# Patient Record
Sex: Female | Born: 1935
Health system: Southern US, Community
[De-identification: ages and names within clinical notes are randomized; demographics above are authoritative.]

## PROBLEM LIST (undated history)

## (undated) DIAGNOSIS — I639 Cerebral infarction, unspecified: Secondary | ICD-10-CM

## (undated) DIAGNOSIS — M1990 Unspecified osteoarthritis, unspecified site: Secondary | ICD-10-CM

## (undated) DIAGNOSIS — I10 Essential (primary) hypertension: Secondary | ICD-10-CM

## (undated) DIAGNOSIS — J449 Chronic obstructive pulmonary disease, unspecified: Secondary | ICD-10-CM

## (undated) HISTORY — PX: EYE SURGERY: SHX253

---

## 2000-10-23 ENCOUNTER — Ambulatory Visit (HOSPITAL_COMMUNITY): Admission: RE | Admit: 2000-10-23 | Discharge: 2000-10-23 | Payer: Self-pay | Admitting: Pulmonary Disease

## 2001-02-26 ENCOUNTER — Ambulatory Visit (HOSPITAL_COMMUNITY): Admission: RE | Admit: 2001-02-26 | Discharge: 2001-02-26 | Payer: Self-pay | Admitting: Pulmonary Disease

## 2002-03-05 ENCOUNTER — Ambulatory Visit (HOSPITAL_COMMUNITY): Admission: RE | Admit: 2002-03-05 | Discharge: 2002-03-05 | Payer: Self-pay | Admitting: Pulmonary Disease

## 2002-08-15 ENCOUNTER — Ambulatory Visit (HOSPITAL_COMMUNITY): Admission: RE | Admit: 2002-08-15 | Discharge: 2002-08-15 | Payer: Self-pay | Admitting: *Deleted

## 2002-08-15 ENCOUNTER — Encounter: Payer: Self-pay | Admitting: *Deleted

## 2002-08-26 ENCOUNTER — Ambulatory Visit (HOSPITAL_COMMUNITY): Admission: RE | Admit: 2002-08-26 | Discharge: 2002-08-26 | Payer: Self-pay | Admitting: General Surgery

## 2002-12-27 ENCOUNTER — Emergency Department (HOSPITAL_COMMUNITY): Admission: EM | Admit: 2002-12-27 | Discharge: 2002-12-27 | Payer: Self-pay | Admitting: Emergency Medicine

## 2003-03-12 ENCOUNTER — Ambulatory Visit (HOSPITAL_COMMUNITY): Admission: RE | Admit: 2003-03-12 | Discharge: 2003-03-12 | Payer: Self-pay | Admitting: Pulmonary Disease

## 2003-04-04 ENCOUNTER — Emergency Department (HOSPITAL_COMMUNITY): Admission: EM | Admit: 2003-04-04 | Discharge: 2003-04-04 | Payer: Self-pay | Admitting: Emergency Medicine

## 2003-04-11 ENCOUNTER — Encounter: Payer: Self-pay | Admitting: Emergency Medicine

## 2003-04-11 ENCOUNTER — Emergency Department (HOSPITAL_COMMUNITY): Admission: EM | Admit: 2003-04-11 | Discharge: 2003-04-11 | Payer: Self-pay | Admitting: Emergency Medicine

## 2003-09-02 ENCOUNTER — Emergency Department (HOSPITAL_COMMUNITY): Admission: EM | Admit: 2003-09-02 | Discharge: 2003-09-02 | Payer: Self-pay | Admitting: Emergency Medicine

## 2004-03-25 ENCOUNTER — Ambulatory Visit (HOSPITAL_COMMUNITY): Admission: RE | Admit: 2004-03-25 | Discharge: 2004-03-25 | Payer: Self-pay

## 2005-03-28 ENCOUNTER — Ambulatory Visit (HOSPITAL_COMMUNITY): Admission: RE | Admit: 2005-03-28 | Discharge: 2005-03-28 | Payer: Self-pay | Admitting: Pulmonary Disease

## 2006-03-30 ENCOUNTER — Ambulatory Visit (HOSPITAL_COMMUNITY): Admission: RE | Admit: 2006-03-30 | Discharge: 2006-03-30 | Payer: Self-pay | Admitting: Pulmonary Disease

## 2006-09-13 ENCOUNTER — Ambulatory Visit (HOSPITAL_COMMUNITY): Admission: RE | Admit: 2006-09-13 | Discharge: 2006-09-13 | Payer: Self-pay | Admitting: Pulmonary Disease

## 2006-09-15 ENCOUNTER — Ambulatory Visit (HOSPITAL_COMMUNITY): Admission: RE | Admit: 2006-09-15 | Discharge: 2006-09-15 | Payer: Self-pay | Admitting: Pulmonary Disease

## 2007-03-13 ENCOUNTER — Ambulatory Visit (HOSPITAL_COMMUNITY): Admission: RE | Admit: 2007-03-13 | Discharge: 2007-03-13 | Payer: Self-pay | Admitting: Pulmonary Disease

## 2007-04-02 ENCOUNTER — Ambulatory Visit (HOSPITAL_COMMUNITY): Admission: RE | Admit: 2007-04-02 | Discharge: 2007-04-02 | Payer: Self-pay | Admitting: Pulmonary Disease

## 2007-12-12 ENCOUNTER — Ambulatory Visit (HOSPITAL_COMMUNITY): Admission: RE | Admit: 2007-12-12 | Discharge: 2007-12-12 | Payer: Self-pay | Admitting: Pulmonary Disease

## 2008-02-13 ENCOUNTER — Ambulatory Visit (HOSPITAL_COMMUNITY): Admission: RE | Admit: 2008-02-13 | Discharge: 2008-02-13 | Payer: Self-pay | Admitting: Pulmonary Disease

## 2008-04-02 ENCOUNTER — Ambulatory Visit (HOSPITAL_COMMUNITY): Admission: RE | Admit: 2008-04-02 | Discharge: 2008-04-02 | Payer: Self-pay | Admitting: Pulmonary Disease

## 2009-04-03 ENCOUNTER — Ambulatory Visit (HOSPITAL_COMMUNITY): Admission: RE | Admit: 2009-04-03 | Discharge: 2009-04-03 | Payer: Self-pay | Admitting: Pulmonary Disease

## 2009-05-29 ENCOUNTER — Ambulatory Visit (HOSPITAL_COMMUNITY): Admission: RE | Admit: 2009-05-29 | Discharge: 2009-05-30 | Payer: Self-pay | Admitting: Ophthalmology

## 2009-07-09 ENCOUNTER — Ambulatory Visit (HOSPITAL_COMMUNITY): Admission: RE | Admit: 2009-07-09 | Discharge: 2009-07-09 | Payer: Self-pay | Admitting: Ophthalmology

## 2009-08-06 ENCOUNTER — Ambulatory Visit (HOSPITAL_COMMUNITY): Admission: RE | Admit: 2009-08-06 | Discharge: 2009-08-06 | Payer: Self-pay | Admitting: Ophthalmology

## 2010-03-04 ENCOUNTER — Ambulatory Visit (HOSPITAL_COMMUNITY): Admission: RE | Admit: 2010-03-04 | Discharge: 2010-03-04 | Payer: Self-pay | Admitting: Pulmonary Disease

## 2010-04-05 ENCOUNTER — Ambulatory Visit (HOSPITAL_COMMUNITY): Admission: RE | Admit: 2010-04-05 | Discharge: 2010-04-05 | Payer: Self-pay | Admitting: Pulmonary Disease

## 2010-04-12 ENCOUNTER — Ambulatory Visit (HOSPITAL_COMMUNITY): Admission: RE | Admit: 2010-04-12 | Discharge: 2010-04-12 | Payer: Self-pay | Admitting: Pulmonary Disease

## 2010-04-13 ENCOUNTER — Ambulatory Visit (HOSPITAL_COMMUNITY): Admission: RE | Admit: 2010-04-13 | Discharge: 2010-04-13 | Payer: Self-pay | Admitting: Pulmonary Disease

## 2010-09-21 LAB — BASIC METABOLIC PANEL
BUN: 16 mg/dL (ref 6–23)
CO2: 27 mEq/L (ref 19–32)
Calcium: 9.7 mg/dL (ref 8.4–10.5)
Chloride: 101 mEq/L (ref 96–112)
Creatinine, Ser: 0.62 mg/dL (ref 0.4–1.2)
GFR calc Af Amer: >60 mL/min (ref >60)
GFR calc non Af Amer: >60 mL/min (ref >60)
Glucose, Bld: 97 mg/dL (ref 70–99)
Potassium: 3.6 mEq/L (ref 3.5–5.1)
Sodium: 137 mEq/L (ref 135–145)

## 2010-09-21 LAB — HEMOGLOBIN AND HEMATOCRIT, BLOOD
HCT: 39.2 % (ref 36.0–46.0)
Hemoglobin: 12.8 g/dL (ref 12.0–15.0)

## 2010-11-05 NOTE — H&P (Signed)
   [name], [name]                           ACCOUNT NO.:  [id number]   MEDICAL RECORD NO.:  [id number]                   PATIENT TYPE:  AMB   LOCATION:                                       FACILITY:  [facility]   PHYSICIAN:  Dalia Heading, M.D.               DATE OF BIRTH:  06-Jan-1935   DATE OF ADMISSION:  08/26/2002  DATE OF DISCHARGE:                                HISTORY & PHYSICAL   CHIEF COMPLAINT:  History of colon polyps.   HISTORY OF PRESENT ILLNESS:  The patient is a 76 year old black female who  was referred for follow-up colonoscopy.  She last had a colonoscopy in 2001.  Was found to have multiple polyps.  They were all benign.  She now presents  for follow-up colonoscopy.  She denies any abdominal complaints.  No melena  or hematochezia have been noted.   PAST MEDICAL HISTORY:  Hypertension.   PAST SURGICAL HISTORY:  As noted above.   CURRENT MEDICATIONS:  1. Hydrochlorothiazide.  2. Evista.  3. Verapamil.  4. Eye drops.   ALLERGIES:  No known drug allergies.   REVIEW OF SYSTEMS:  Noncontributory.   PHYSICAL EXAMINATION:  GENERAL:  The patient is a well-developed, well-  nourished black female in no acute distress.  VITAL SIGNS:  She is afebrile and vital signs are stable.  LUNGS:  Clear to auscultation with equal breath sounds bilaterally.  HEART:  Regular rate and rhythm without S3, S4, or murmurs.  ABDOMEN:  Soft, nontender, nondistended.  No hepatosplenomegaly or masses  are noted.  RECTAL:  Deferred to the procedure.   IMPRESSION:  History of colon polyps.    PLAN:  The patient is scheduled for a colonoscopy on August 27, 2002.  The  risks and benefits of the procedure including bleeding and perforation were  fully explained to the patient, gave informed consent.                                               Dalia Heading, M.D.    MAJ/MEDQ  D:  08/20/2002  T:  08/20/2002  Job:  [id number]   cc:   Ramon Dredge L. Juanetta Gosling, M.D.  [address]  [place]  [place] [address]  Fax: [phone]

## 2011-02-28 ENCOUNTER — Other Ambulatory Visit (HOSPITAL_COMMUNITY): Payer: Self-pay | Admitting: Pulmonary Disease

## 2011-02-28 DIAGNOSIS — Z139 Encounter for screening, unspecified: Secondary | ICD-10-CM

## 2011-04-11 ENCOUNTER — Ambulatory Visit (HOSPITAL_COMMUNITY)
Admission: RE | Admit: 2011-04-11 | Discharge: 2011-04-11 | Disposition: A | Discharge status: Home / Self Care | Payer: Medicare Other | Source: Ambulatory Visit | Attending: Pulmonary Disease | Admitting: Pulmonary Disease

## 2011-04-11 DIAGNOSIS — Z1231 Encounter for screening mammogram for malignant neoplasm of breast: Secondary | ICD-10-CM | POA: Insufficient documentation

## 2011-04-11 DIAGNOSIS — Z139 Encounter for screening, unspecified: Secondary | ICD-10-CM

## 2011-07-07 DIAGNOSIS — H409 Unspecified glaucoma: Secondary | ICD-10-CM | POA: Diagnosis not present

## 2011-07-07 DIAGNOSIS — H4011X0 Primary open-angle glaucoma, stage unspecified: Secondary | ICD-10-CM | POA: Diagnosis not present

## 2011-08-18 DIAGNOSIS — H4011X0 Primary open-angle glaucoma, stage unspecified: Secondary | ICD-10-CM | POA: Diagnosis not present

## 2011-08-18 DIAGNOSIS — H409 Unspecified glaucoma: Secondary | ICD-10-CM | POA: Diagnosis not present

## 2011-08-19 DIAGNOSIS — H409 Unspecified glaucoma: Secondary | ICD-10-CM | POA: Diagnosis not present

## 2011-08-19 DIAGNOSIS — I10 Essential (primary) hypertension: Secondary | ICD-10-CM | POA: Diagnosis not present

## 2011-08-19 DIAGNOSIS — Z23 Encounter for immunization: Secondary | ICD-10-CM | POA: Diagnosis not present

## 2011-09-01 DIAGNOSIS — M129 Arthropathy, unspecified: Secondary | ICD-10-CM | POA: Diagnosis not present

## 2011-09-01 DIAGNOSIS — H409 Unspecified glaucoma: Secondary | ICD-10-CM | POA: Diagnosis not present

## 2011-09-01 DIAGNOSIS — R9431 Abnormal electrocardiogram [ECG] [EKG]: Secondary | ICD-10-CM | POA: Diagnosis not present

## 2011-09-01 DIAGNOSIS — H4011X0 Primary open-angle glaucoma, stage unspecified: Secondary | ICD-10-CM | POA: Diagnosis not present

## 2011-09-01 DIAGNOSIS — Z9889 Other specified postprocedural states: Secondary | ICD-10-CM | POA: Diagnosis not present

## 2011-09-01 DIAGNOSIS — I10 Essential (primary) hypertension: Secondary | ICD-10-CM | POA: Diagnosis not present

## 2011-09-01 DIAGNOSIS — J449 Chronic obstructive pulmonary disease, unspecified: Secondary | ICD-10-CM | POA: Diagnosis not present

## 2011-09-01 DIAGNOSIS — Z9849 Cataract extraction status, unspecified eye: Secondary | ICD-10-CM | POA: Diagnosis not present

## 2011-09-01 DIAGNOSIS — Z79899 Other long term (current) drug therapy: Secondary | ICD-10-CM | POA: Diagnosis not present

## 2011-09-01 DIAGNOSIS — Z87891 Personal history of nicotine dependence: Secondary | ICD-10-CM | POA: Diagnosis not present

## 2011-09-05 ENCOUNTER — Emergency Department (HOSPITAL_COMMUNITY)
Admission: EM | Admit: 2011-09-05 | Discharge: 2011-09-05 | Disposition: A | Discharge status: Home / Self Care | Payer: Medicare Other | Attending: Emergency Medicine | Admitting: Emergency Medicine

## 2011-09-05 ENCOUNTER — Encounter (HOSPITAL_COMMUNITY): Payer: Self-pay | Admitting: *Deleted

## 2011-09-05 DIAGNOSIS — J3489 Other specified disorders of nose and nasal sinuses: Secondary | ICD-10-CM | POA: Diagnosis not present

## 2011-09-05 DIAGNOSIS — I10 Essential (primary) hypertension: Secondary | ICD-10-CM | POA: Diagnosis not present

## 2011-09-05 DIAGNOSIS — R05 Cough: Secondary | ICD-10-CM | POA: Insufficient documentation

## 2011-09-05 DIAGNOSIS — R059 Cough, unspecified: Secondary | ICD-10-CM

## 2011-09-05 DIAGNOSIS — Z79899 Other long term (current) drug therapy: Secondary | ICD-10-CM | POA: Insufficient documentation

## 2011-09-05 DIAGNOSIS — M129 Arthropathy, unspecified: Secondary | ICD-10-CM | POA: Insufficient documentation

## 2011-09-05 HISTORY — DX: Unspecified osteoarthritis, unspecified site: M19.90

## 2011-09-05 HISTORY — DX: Essential (primary) hypertension: I10

## 2011-09-05 MED ORDER — PREDNISONE 20 MG PO TABS
40.0000 mg | ORAL_TABLET | Freq: Once | ORAL | Status: AC
  Administered 2011-09-05: 40 mg via ORAL
  Filled 2011-09-05: qty 2

## 2011-09-05 MED ORDER — GUAIFENESIN 100 MG/5ML PO LIQD: 100.0000 mg | ORAL | Status: AC | PRN

## 2011-09-05 MED ORDER — PREDNISONE 20 MG PO TABS: 40.0000 mg | ORAL_TABLET | Freq: Every day | ORAL | Status: DC

## 2011-09-05 NOTE — ED Notes (Signed)
Cough and cold symptoms for four days

## 2011-09-05 NOTE — Discharge Instructions (Signed)
Cough, Adult  A cough is a reflex. It helps you clear your throat and airways. A cough can help heal your body. A cough can last 2 or 3 weeks (acute) or may last more than 8 weeks (chronic). Some common causes of a cough can include an infection, allergy, or a cold. HOME CARE  Only take medicine as told by your doctor.   If given, take your medicines (antibiotics) as told. Finish them even if you start to feel better.   Use a cold steam vaporizer or humidier in your home. This can help loosen thick spit (secretions).   Sleep so you are almost sitting up (semi-upright). Use pillows to do this. This helps reduce coughing.   Rest as needed.   Stop smoking if you smoke.  GET HELP RIGHT AWAY IF:  You have yellowish-white fluid (pus) in your thick spit.   Your cough gets worse.   Your medicine does not reduce coughing, and you are losing sleep.   You cough up blood.   You have trouble breathing.   Your pain gets worse and medicine does not help.   You have a fever.  MAKE SURE YOU:   Understand these instructions.   Will watch your condition.   Will get help right away if you are not doing well or get worse.  Document Released: 02/17/2011 Document Revised: 05/26/2011 Document Reviewed: 02/17/2011 [facility] Patient Information 2012 [place], [place].    Please call and follow up with Dr. Juanetta Gosling later this week if your symptoms are not improving over the next 2-3 days.

## 2011-09-13 NOTE — ED Provider Notes (Signed)
History     CSN: [id number]  Arrival date & time 09/05/11  1725   First MD Initiated Contact with Patient 09/05/11 2021      Chief Complaint  Patient presents with  . Cough    (Consider location/radiation/quality/duration/timing/severity/associated sxs/prior treatment) HPI Comments: Pt with dry coughing for about 4 days, no known fevers, no sig SOB.  No CP.  No N/V/D.  She denies smoking.  No prior h/o intubation, hospitalization for pneumonia or COPD or asthma.  No prior h/o CHF.  Denies lower leg swelling.  No CP, pleurisy.  Has not tried any medications at home.  She wonders if she needs a "little antibiotic."  Has not been seen by PCP.    Patient is a 77 y.o. female presenting with cough. The history is provided by the patient.  Cough Associated symptoms include rhinorrhea. Pertinent negatives include no chest pain and no shortness of breath.    Past Medical History  Diagnosis Date  . Hypertension   . Arthritis     History reviewed. No pertinent past surgical history.  History reviewed. No pertinent family history.  History  Substance Use Topics  . Smoking status: Former Games developer  . Smokeless tobacco: Not on file  . Alcohol Use: No    OB History    Grav Para Term Preterm Abortions TAB SAB Ect Mult Living                  Review of Systems  Constitutional: Negative.   HENT: Positive for congestion and rhinorrhea. Negative for postnasal drip.   Respiratory: Positive for cough. Negative for chest tightness and shortness of breath.   Cardiovascular: Negative for chest pain.  Musculoskeletal: Negative for back pain.    Allergies  Review of patient's allergies indicates no known allergies.  Home Medications   Current Outpatient Rx  Name Route Sig Dispense Refill  . GUAIFENESIN 100 MG/5ML PO LIQD Oral Take 5-10 mLs (100-200 mg total) by mouth every 4 (four) hours as needed for cough. 120 mL 0  . PREDNISONE 20 MG PO TABS Oral Take 2 tablets (40 mg total) by  mouth daily. 8 tablet 0    BP 162/62  Pulse 63  Temp(Src) 97.9 F (36.6 C) (Oral)  Resp 16  Ht 5' 1.5" (1.562 m)  Wt 158 lb (71.668 kg)  BMI 29.37 kg/m2  SpO2 99%  Physical Exam  Nursing note and vitals reviewed. Constitutional: She appears well-developed and well-nourished. No distress.  HENT:  Head: Normocephalic and atraumatic.  Cardiovascular: Normal rate and regular rhythm.   Pulmonary/Chest: Effort normal. No respiratory distress. She has no wheezes. She has no rales. She exhibits no tenderness.  Abdominal: Soft. She exhibits no distension. There is no tenderness.  Musculoskeletal: She exhibits no edema.  Neurological: She is alert.  Skin: Skin is warm.    ED Course  Procedures (including critical care time)  Labs Reviewed - No data to display No results found.   1. Cough       MDM  RA sats are 99% and normal.  No sig wheezing, minimal dry coughing during my exam.  No CP.  PT is reassured, doubt she needs abx.  Likely just a cough and URI.  No fever.  Pt has inhaler at home already, given Rx for steroids and antitussive.  Pt told to f/u with PCP next week fir not improved by then.          Gavin Pound. Abhimanyu Cruces, MD 09/13/11 0630

## 2011-09-27 DIAGNOSIS — H4011X0 Primary open-angle glaucoma, stage unspecified: Secondary | ICD-10-CM | POA: Diagnosis not present

## 2011-09-27 DIAGNOSIS — M129 Arthropathy, unspecified: Secondary | ICD-10-CM | POA: Diagnosis not present

## 2011-09-27 DIAGNOSIS — J449 Chronic obstructive pulmonary disease, unspecified: Secondary | ICD-10-CM | POA: Diagnosis not present

## 2011-09-27 DIAGNOSIS — I10 Essential (primary) hypertension: Secondary | ICD-10-CM | POA: Diagnosis not present

## 2011-09-27 DIAGNOSIS — J4489 Other specified chronic obstructive pulmonary disease: Secondary | ICD-10-CM | POA: Diagnosis not present

## 2011-09-27 DIAGNOSIS — H409 Unspecified glaucoma: Secondary | ICD-10-CM | POA: Diagnosis not present

## 2011-09-27 DIAGNOSIS — Z9889 Other specified postprocedural states: Secondary | ICD-10-CM | POA: Diagnosis not present

## 2011-09-28 DIAGNOSIS — H4011X0 Primary open-angle glaucoma, stage unspecified: Secondary | ICD-10-CM | POA: Diagnosis not present

## 2011-10-03 DIAGNOSIS — R609 Edema, unspecified: Secondary | ICD-10-CM | POA: Diagnosis not present

## 2011-10-03 DIAGNOSIS — I10 Essential (primary) hypertension: Secondary | ICD-10-CM | POA: Diagnosis not present

## 2011-10-03 DIAGNOSIS — H409 Unspecified glaucoma: Secondary | ICD-10-CM | POA: Diagnosis not present

## 2011-10-05 DIAGNOSIS — H4011X0 Primary open-angle glaucoma, stage unspecified: Secondary | ICD-10-CM | POA: Diagnosis not present

## 2011-10-21 DIAGNOSIS — H409 Unspecified glaucoma: Secondary | ICD-10-CM | POA: Diagnosis not present

## 2011-10-21 DIAGNOSIS — H4011X0 Primary open-angle glaucoma, stage unspecified: Secondary | ICD-10-CM | POA: Diagnosis not present

## 2011-10-21 DIAGNOSIS — Z9889 Other specified postprocedural states: Secondary | ICD-10-CM | POA: Diagnosis not present

## 2011-10-27 DIAGNOSIS — H348392 Tributary (branch) retinal vein occlusion, unspecified eye, stable: Secondary | ICD-10-CM | POA: Diagnosis not present

## 2011-10-27 DIAGNOSIS — H4011X0 Primary open-angle glaucoma, stage unspecified: Secondary | ICD-10-CM | POA: Diagnosis not present

## 2011-10-27 DIAGNOSIS — H409 Unspecified glaucoma: Secondary | ICD-10-CM | POA: Diagnosis not present

## 2011-11-04 DIAGNOSIS — M810 Age-related osteoporosis without current pathological fracture: Secondary | ICD-10-CM | POA: Diagnosis not present

## 2011-11-11 DIAGNOSIS — H4011X0 Primary open-angle glaucoma, stage unspecified: Secondary | ICD-10-CM | POA: Diagnosis not present

## 2011-11-11 DIAGNOSIS — H409 Unspecified glaucoma: Secondary | ICD-10-CM | POA: Diagnosis not present

## 2011-11-22 DIAGNOSIS — I10 Essential (primary) hypertension: Secondary | ICD-10-CM | POA: Diagnosis not present

## 2011-11-22 DIAGNOSIS — M1990 Unspecified osteoarthritis, unspecified site: Secondary | ICD-10-CM | POA: Diagnosis not present

## 2011-11-22 DIAGNOSIS — J449 Chronic obstructive pulmonary disease, unspecified: Secondary | ICD-10-CM | POA: Diagnosis not present

## 2011-11-22 DIAGNOSIS — R609 Edema, unspecified: Secondary | ICD-10-CM | POA: Diagnosis not present

## 2011-11-24 DIAGNOSIS — R609 Edema, unspecified: Secondary | ICD-10-CM | POA: Diagnosis not present

## 2011-11-24 DIAGNOSIS — M1990 Unspecified osteoarthritis, unspecified site: Secondary | ICD-10-CM | POA: Diagnosis not present

## 2011-11-24 DIAGNOSIS — I10 Essential (primary) hypertension: Secondary | ICD-10-CM | POA: Diagnosis not present

## 2011-12-01 DIAGNOSIS — H4011X0 Primary open-angle glaucoma, stage unspecified: Secondary | ICD-10-CM | POA: Diagnosis not present

## 2011-12-01 DIAGNOSIS — H3560 Retinal hemorrhage, unspecified eye: Secondary | ICD-10-CM | POA: Diagnosis not present

## 2011-12-01 DIAGNOSIS — H348392 Tributary (branch) retinal vein occlusion, unspecified eye, stable: Secondary | ICD-10-CM | POA: Diagnosis not present

## 2011-12-01 DIAGNOSIS — H409 Unspecified glaucoma: Secondary | ICD-10-CM | POA: Diagnosis not present

## 2011-12-01 DIAGNOSIS — H35359 Cystoid macular degeneration, unspecified eye: Secondary | ICD-10-CM | POA: Diagnosis not present

## 2011-12-15 DIAGNOSIS — H409 Unspecified glaucoma: Secondary | ICD-10-CM | POA: Diagnosis not present

## 2011-12-15 DIAGNOSIS — H348392 Tributary (branch) retinal vein occlusion, unspecified eye, stable: Secondary | ICD-10-CM | POA: Diagnosis not present

## 2011-12-15 DIAGNOSIS — H4010X0 Unspecified open-angle glaucoma, stage unspecified: Secondary | ICD-10-CM | POA: Diagnosis not present

## 2011-12-15 DIAGNOSIS — H4011X0 Primary open-angle glaucoma, stage unspecified: Secondary | ICD-10-CM | POA: Diagnosis not present

## 2012-01-02 DIAGNOSIS — I10 Essential (primary) hypertension: Secondary | ICD-10-CM | POA: Diagnosis not present

## 2012-01-09 DIAGNOSIS — I10 Essential (primary) hypertension: Secondary | ICD-10-CM | POA: Diagnosis not present

## 2012-01-09 DIAGNOSIS — H348392 Tributary (branch) retinal vein occlusion, unspecified eye, stable: Secondary | ICD-10-CM | POA: Diagnosis not present

## 2012-01-09 DIAGNOSIS — H35359 Cystoid macular degeneration, unspecified eye: Secondary | ICD-10-CM | POA: Diagnosis not present

## 2012-01-09 DIAGNOSIS — H4011X0 Primary open-angle glaucoma, stage unspecified: Secondary | ICD-10-CM | POA: Diagnosis not present

## 2012-01-09 DIAGNOSIS — H409 Unspecified glaucoma: Secondary | ICD-10-CM | POA: Diagnosis not present

## 2012-01-09 DIAGNOSIS — H35039 Hypertensive retinopathy, unspecified eye: Secondary | ICD-10-CM | POA: Diagnosis not present

## 2012-02-22 DIAGNOSIS — J449 Chronic obstructive pulmonary disease, unspecified: Secondary | ICD-10-CM | POA: Diagnosis not present

## 2012-02-22 DIAGNOSIS — I10 Essential (primary) hypertension: Secondary | ICD-10-CM | POA: Diagnosis not present

## 2012-02-22 DIAGNOSIS — R05 Cough: Secondary | ICD-10-CM | POA: Diagnosis not present

## 2012-02-28 DIAGNOSIS — Z23 Encounter for immunization: Secondary | ICD-10-CM | POA: Diagnosis not present

## 2012-03-08 ENCOUNTER — Ambulatory Visit (HOSPITAL_COMMUNITY): Payer: Medicare Other

## 2012-03-08 ENCOUNTER — Other Ambulatory Visit (HOSPITAL_COMMUNITY): Payer: Self-pay | Admitting: Pulmonary Disease

## 2012-03-08 DIAGNOSIS — H409 Unspecified glaucoma: Secondary | ICD-10-CM | POA: Diagnosis not present

## 2012-03-08 DIAGNOSIS — H4011X0 Primary open-angle glaucoma, stage unspecified: Secondary | ICD-10-CM | POA: Diagnosis not present

## 2012-03-08 DIAGNOSIS — H35359 Cystoid macular degeneration, unspecified eye: Secondary | ICD-10-CM | POA: Diagnosis not present

## 2012-03-08 DIAGNOSIS — Z139 Encounter for screening, unspecified: Secondary | ICD-10-CM

## 2012-03-08 DIAGNOSIS — H3560 Retinal hemorrhage, unspecified eye: Secondary | ICD-10-CM | POA: Diagnosis not present

## 2012-03-08 DIAGNOSIS — H348392 Tributary (branch) retinal vein occlusion, unspecified eye, stable: Secondary | ICD-10-CM | POA: Diagnosis not present

## 2012-03-16 DIAGNOSIS — H4011X0 Primary open-angle glaucoma, stage unspecified: Secondary | ICD-10-CM | POA: Diagnosis not present

## 2012-03-16 DIAGNOSIS — H409 Unspecified glaucoma: Secondary | ICD-10-CM | POA: Diagnosis not present

## 2012-03-16 DIAGNOSIS — Z760 Encounter for issue of repeat prescription: Secondary | ICD-10-CM | POA: Diagnosis not present

## 2012-04-16 ENCOUNTER — Ambulatory Visit (HOSPITAL_COMMUNITY)
Admission: RE | Admit: 2012-04-16 | Discharge: 2012-04-16 | Disposition: A | Discharge status: Home / Self Care | Payer: Medicare Other | Source: Ambulatory Visit | Attending: Pulmonary Disease | Admitting: Pulmonary Disease

## 2012-04-16 DIAGNOSIS — Z1231 Encounter for screening mammogram for malignant neoplasm of breast: Secondary | ICD-10-CM | POA: Insufficient documentation

## 2012-04-16 DIAGNOSIS — Z139 Encounter for screening, unspecified: Secondary | ICD-10-CM

## 2012-05-07 DIAGNOSIS — M810 Age-related osteoporosis without current pathological fracture: Secondary | ICD-10-CM | POA: Diagnosis not present

## 2012-05-23 DIAGNOSIS — I10 Essential (primary) hypertension: Secondary | ICD-10-CM | POA: Diagnosis not present

## 2012-05-23 DIAGNOSIS — M810 Age-related osteoporosis without current pathological fracture: Secondary | ICD-10-CM | POA: Diagnosis not present

## 2012-05-23 DIAGNOSIS — M1990 Unspecified osteoarthritis, unspecified site: Secondary | ICD-10-CM | POA: Diagnosis not present

## 2012-05-23 DIAGNOSIS — J449 Chronic obstructive pulmonary disease, unspecified: Secondary | ICD-10-CM | POA: Diagnosis not present

## 2012-06-07 DIAGNOSIS — H4011X0 Primary open-angle glaucoma, stage unspecified: Secondary | ICD-10-CM | POA: Diagnosis not present

## 2012-06-07 DIAGNOSIS — H3560 Retinal hemorrhage, unspecified eye: Secondary | ICD-10-CM | POA: Diagnosis not present

## 2012-06-07 DIAGNOSIS — H35359 Cystoid macular degeneration, unspecified eye: Secondary | ICD-10-CM | POA: Diagnosis not present

## 2012-06-07 DIAGNOSIS — H31009 Unspecified chorioretinal scars, unspecified eye: Secondary | ICD-10-CM | POA: Diagnosis not present

## 2012-06-07 DIAGNOSIS — H409 Unspecified glaucoma: Secondary | ICD-10-CM | POA: Diagnosis not present

## 2012-06-07 DIAGNOSIS — H348392 Tributary (branch) retinal vein occlusion, unspecified eye, stable: Secondary | ICD-10-CM | POA: Diagnosis not present

## 2012-08-23 DIAGNOSIS — H4011X0 Primary open-angle glaucoma, stage unspecified: Secondary | ICD-10-CM | POA: Diagnosis not present

## 2012-08-23 DIAGNOSIS — H409 Unspecified glaucoma: Secondary | ICD-10-CM | POA: Diagnosis not present

## 2012-09-04 DIAGNOSIS — M25519 Pain in unspecified shoulder: Secondary | ICD-10-CM | POA: Diagnosis not present

## 2012-09-04 DIAGNOSIS — I10 Essential (primary) hypertension: Secondary | ICD-10-CM | POA: Diagnosis not present

## 2012-09-04 DIAGNOSIS — M1990 Unspecified osteoarthritis, unspecified site: Secondary | ICD-10-CM | POA: Diagnosis not present

## 2012-10-04 DIAGNOSIS — H4011X0 Primary open-angle glaucoma, stage unspecified: Secondary | ICD-10-CM | POA: Diagnosis not present

## 2012-10-04 DIAGNOSIS — H409 Unspecified glaucoma: Secondary | ICD-10-CM | POA: Diagnosis not present

## 2012-10-11 DIAGNOSIS — H348392 Tributary (branch) retinal vein occlusion, unspecified eye, stable: Secondary | ICD-10-CM | POA: Diagnosis not present

## 2012-10-11 DIAGNOSIS — H4011X0 Primary open-angle glaucoma, stage unspecified: Secondary | ICD-10-CM | POA: Diagnosis not present

## 2012-10-11 DIAGNOSIS — H409 Unspecified glaucoma: Secondary | ICD-10-CM | POA: Diagnosis not present

## 2012-10-11 DIAGNOSIS — H35359 Cystoid macular degeneration, unspecified eye: Secondary | ICD-10-CM | POA: Diagnosis not present

## 2012-10-11 DIAGNOSIS — H3560 Retinal hemorrhage, unspecified eye: Secondary | ICD-10-CM | POA: Diagnosis not present

## 2012-11-05 DIAGNOSIS — M1990 Unspecified osteoarthritis, unspecified site: Secondary | ICD-10-CM | POA: Diagnosis not present

## 2012-12-13 DIAGNOSIS — H4011X0 Primary open-angle glaucoma, stage unspecified: Secondary | ICD-10-CM | POA: Diagnosis not present

## 2012-12-13 DIAGNOSIS — H409 Unspecified glaucoma: Secondary | ICD-10-CM | POA: Diagnosis not present

## 2012-12-13 DIAGNOSIS — H348392 Tributary (branch) retinal vein occlusion, unspecified eye, stable: Secondary | ICD-10-CM | POA: Diagnosis not present

## 2013-01-02 DIAGNOSIS — I10 Essential (primary) hypertension: Secondary | ICD-10-CM | POA: Diagnosis not present

## 2013-01-02 DIAGNOSIS — R21 Rash and other nonspecific skin eruption: Secondary | ICD-10-CM | POA: Diagnosis not present

## 2013-01-02 DIAGNOSIS — J449 Chronic obstructive pulmonary disease, unspecified: Secondary | ICD-10-CM | POA: Diagnosis not present

## 2013-01-02 DIAGNOSIS — R631 Polydipsia: Secondary | ICD-10-CM | POA: Diagnosis not present

## 2013-01-17 ENCOUNTER — Encounter (HOSPITAL_COMMUNITY): Payer: Self-pay

## 2013-01-17 ENCOUNTER — Emergency Department (HOSPITAL_COMMUNITY)
Admission: EM | Admit: 2013-01-17 | Discharge: 2013-01-17 | Disposition: A | Discharge status: Home / Self Care | Payer: Medicare Other | Attending: Emergency Medicine | Admitting: Emergency Medicine

## 2013-01-17 DIAGNOSIS — M26609 Unspecified temporomandibular joint disorder, unspecified side: Secondary | ICD-10-CM | POA: Diagnosis not present

## 2013-01-17 DIAGNOSIS — I10 Essential (primary) hypertension: Secondary | ICD-10-CM | POA: Diagnosis not present

## 2013-01-17 DIAGNOSIS — H538 Other visual disturbances: Secondary | ICD-10-CM | POA: Diagnosis not present

## 2013-01-17 DIAGNOSIS — IMO0002 Reserved for concepts with insufficient information to code with codable children: Secondary | ICD-10-CM | POA: Diagnosis not present

## 2013-01-17 DIAGNOSIS — Z87891 Personal history of nicotine dependence: Secondary | ICD-10-CM | POA: Insufficient documentation

## 2013-01-17 DIAGNOSIS — M129 Arthropathy, unspecified: Secondary | ICD-10-CM | POA: Insufficient documentation

## 2013-01-17 DIAGNOSIS — R6884 Jaw pain: Secondary | ICD-10-CM | POA: Diagnosis not present

## 2013-01-17 DIAGNOSIS — H9209 Otalgia, unspecified ear: Secondary | ICD-10-CM | POA: Diagnosis not present

## 2013-01-17 NOTE — ED Provider Notes (Signed)
CSN: [id number]     Arrival date & time 01/17/13  1306 History    This chart was scribed for Candyce Churn, MD by Leone Payor, ED Scribe. This patient was seen in room [facility]03/[facility]03 and the patient's care was started 1:29 PM.   First MD Initiated Contact with Patient 01/17/13 1326     Chief Complaint  Patient presents with  . Otalgia  . Jaw Pain    Patient is a 78 y.o. female presenting with ear pain. The history is provided by the patient. No language interpreter was used.  Otalgia Location:  Left Severity:  Moderate Onset quality:  Gradual Duration:  7 days Timing:  Constant Progression:  Worsening Chronicity:  New Relieved by:  Nothing Exacerbated by: chewing. Ineffective treatments:  None tried Associated symptoms: no fever    HPI Comments: [name] is a 78 y.o. female who presents to the Emergency Department complaining of 7 days of gradual onset, gradually worsening, constant left jaw pain that progressed to the left ear. Pt states the pain started in her jaw and was painful to bite and chew food. Pt was recently finished doxycycline for a skin infection. She has not been seen by PCP for this current pain. She has associated blurry vision. Pt has not taken OTC pain medication for these symptoms. She denies fevers.   Past Medical History  Diagnosis Date  . Hypertension   . Arthritis    Past Surgical History  Procedure Laterality Date  . Eye surgery     No family history on file. History  Substance Use Topics  . Smoking status: Former Games developer  . Smokeless tobacco: Not on file  . Alcohol Use: No   OB History   Grav Para Term Preterm Abortions TAB SAB Ect Mult Living                 Review of Systems  Constitutional: Negative for fever.  HENT: Positive for ear pain.        Jaw pain  All other systems reviewed and are negative.    Allergies  Review of patient's allergies indicates no known allergies.  Home Medications   Current Outpatient Rx   Name  Route  Sig  Dispense  Refill  . predniSONE (DELTASONE) 20 MG tablet   Oral   Take 2 tablets (40 mg total) by mouth daily.   8 tablet   0    BP 115/98  Pulse 66  Temp(Src) 98.2 F (36.8 C) (Oral)  Resp 20  Ht 5\' 1"  (1.549 m)  Wt 160 lb (72.576 kg)  BMI 30.25 kg/m2  SpO2 99% Physical Exam  Nursing note and vitals reviewed. Constitutional: She is oriented to person, place, and time. She appears well-developed and well-nourished. No distress.  HENT:  Head: Normocephalic and atraumatic.  No tenderness over temporal artery. Left ear canal had small amount dried cerumen obscuring the view of TM. Visualized portion of TM had normal appearance.   Right TM deformed, appears to have perforation. Tenderness to left TMJ and pain with active ROM of jaw.  No pain to gums. No swelling or drainage. Edentulous.   Eyes: Conjunctivae are normal. No scleral icterus.  Neck: Neck supple.  Cardiovascular: Normal rate and intact distal pulses.   Pulmonary/Chest: Effort normal. No stridor. No respiratory distress.  Abdominal: Normal appearance. She exhibits no distension.  Neurological: She is alert and oriented to person, place, and time.  Skin: Skin is warm and dry. No rash noted.  Psychiatric: She has a normal mood and affect. Her behavior is normal.    ED Course   Procedures (including critical care time)  DIAGNOSTIC STUDIES: Oxygen Saturation is 99% on RA, normal by my interpretation.    COORDINATION OF CARE: 1:49 PM Discussed treatment plan with pt at bedside and pt agreed to plan.   Labs Reviewed - No data to display No results found. 1. Temporomandibular joint disorder (TMJ)     MDM  TMJ tenderness and pain with ROM.  No signs of temporal arteritis.  No mastoiditis or OM/OE.  No chest pain or SOB, not ACS.  Advised OTC meds and diet modificiation.  PCP followup.    I personally performed the services described in this documentation, which was scribed in my presence. The  recorded information has been reviewed and is accurate.   Candyce Churn, MD 01/17/13 [phone]

## 2013-01-17 NOTE — ED Notes (Signed)
Patient with no complaints at this time. Respirations even and unlabored. Skin warm/dry. Discharge instructions reviewed with patient at this time. Patient given opportunity to voice concerns/ask questions. Patient discharged at this time and left Emergency Department with steady gait.   

## 2013-01-17 NOTE — ED Notes (Signed)
Pt c/o left earache and left jaw pain for approx 1 week ago.

## 2013-01-17 NOTE — ED Notes (Signed)
C/o L ear pain.  Tympanic membrane reddened, some cerumen noted.  Patient recently on doxycycline for skin infection.  States ear pain began after taking antibx.

## 2013-03-06 DIAGNOSIS — R21 Rash and other nonspecific skin eruption: Secondary | ICD-10-CM | POA: Diagnosis not present

## 2013-03-06 DIAGNOSIS — J449 Chronic obstructive pulmonary disease, unspecified: Secondary | ICD-10-CM | POA: Diagnosis not present

## 2013-03-06 DIAGNOSIS — I10 Essential (primary) hypertension: Secondary | ICD-10-CM | POA: Diagnosis not present

## 2013-03-06 DIAGNOSIS — Z23 Encounter for immunization: Secondary | ICD-10-CM | POA: Diagnosis not present

## 2013-03-07 DIAGNOSIS — R21 Rash and other nonspecific skin eruption: Secondary | ICD-10-CM | POA: Diagnosis not present

## 2013-03-07 DIAGNOSIS — J449 Chronic obstructive pulmonary disease, unspecified: Secondary | ICD-10-CM | POA: Diagnosis not present

## 2013-03-07 DIAGNOSIS — J4489 Other specified chronic obstructive pulmonary disease: Secondary | ICD-10-CM | POA: Diagnosis not present

## 2013-03-07 DIAGNOSIS — I10 Essential (primary) hypertension: Secondary | ICD-10-CM | POA: Diagnosis not present

## 2013-03-11 ENCOUNTER — Other Ambulatory Visit (HOSPITAL_COMMUNITY): Payer: Self-pay | Admitting: Pulmonary Disease

## 2013-03-11 DIAGNOSIS — Z139 Encounter for screening, unspecified: Secondary | ICD-10-CM

## 2013-03-14 DIAGNOSIS — L280 Lichen simplex chronicus: Secondary | ICD-10-CM | POA: Diagnosis not present

## 2013-04-05 IMAGING — MG MM DIGITAL SCREENING BILAT
5 series · 5 of 5 positions shown · non-contrast
Comparison: Prior studies.

DG SCREEN MAMMOGRAM BILATERAL
Bilateral CC and MLO view(s) were taken.

DIGITAL SCREENING MAMMOGRAM WITH CAD:

[L CC]
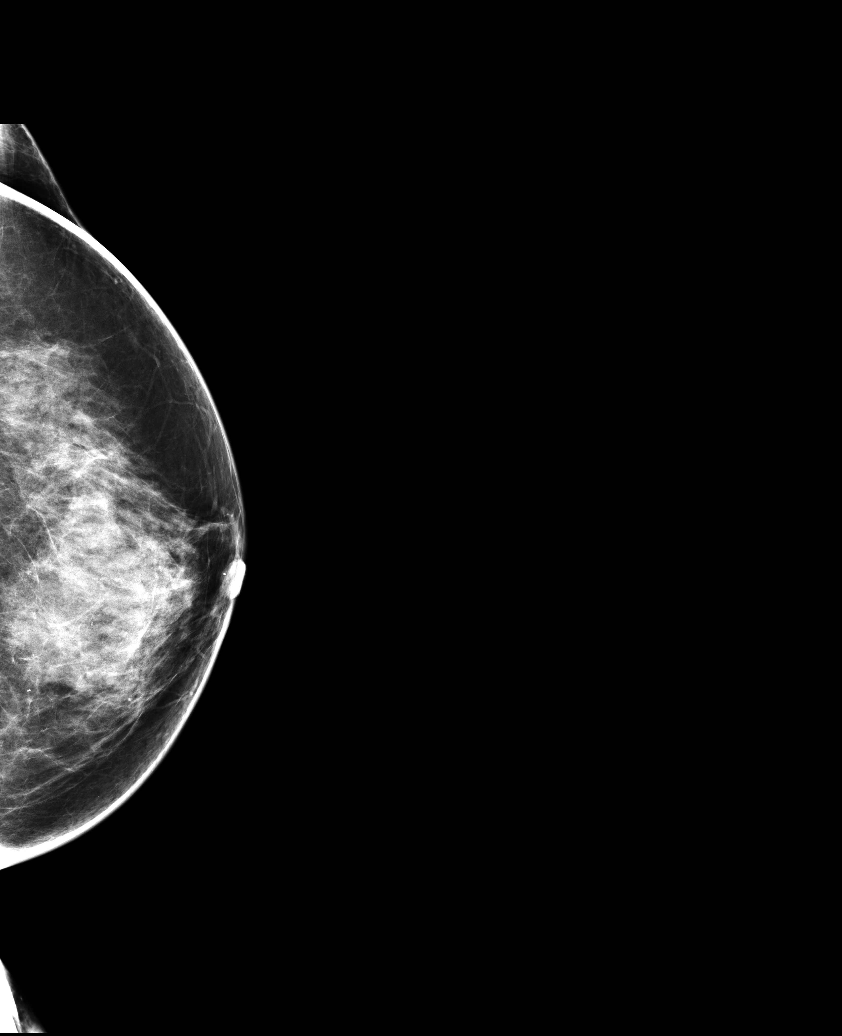

[L MLO]
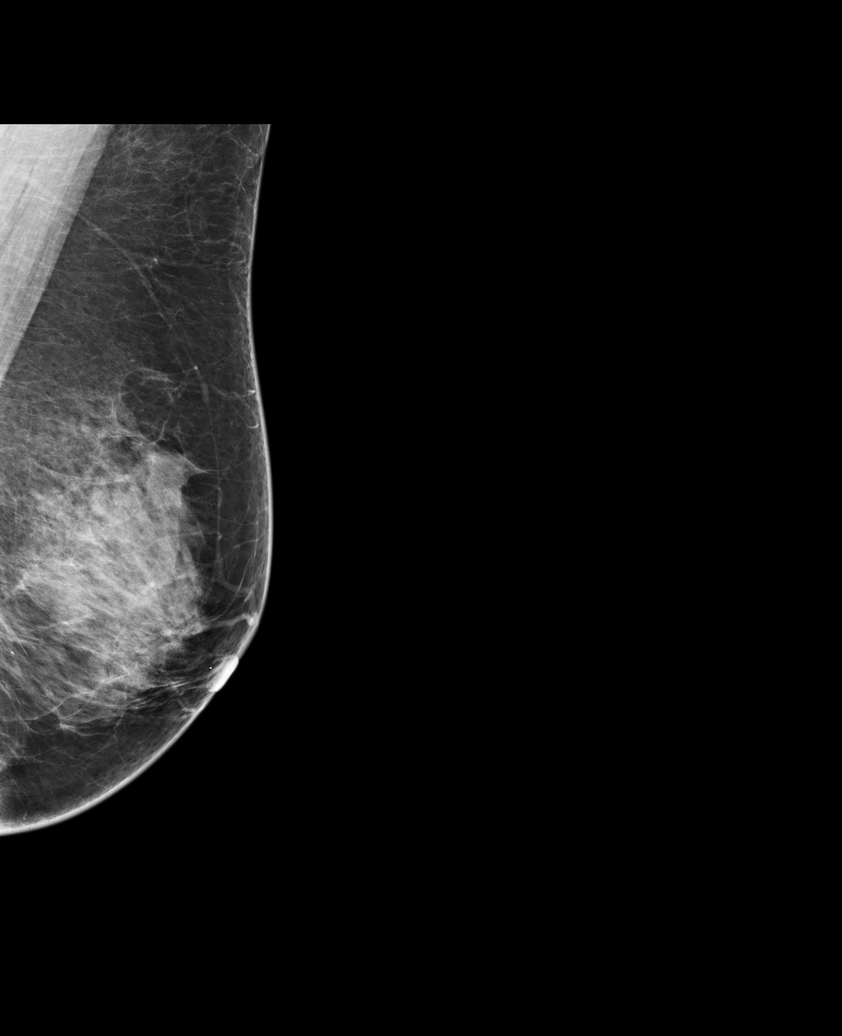

[R CC]
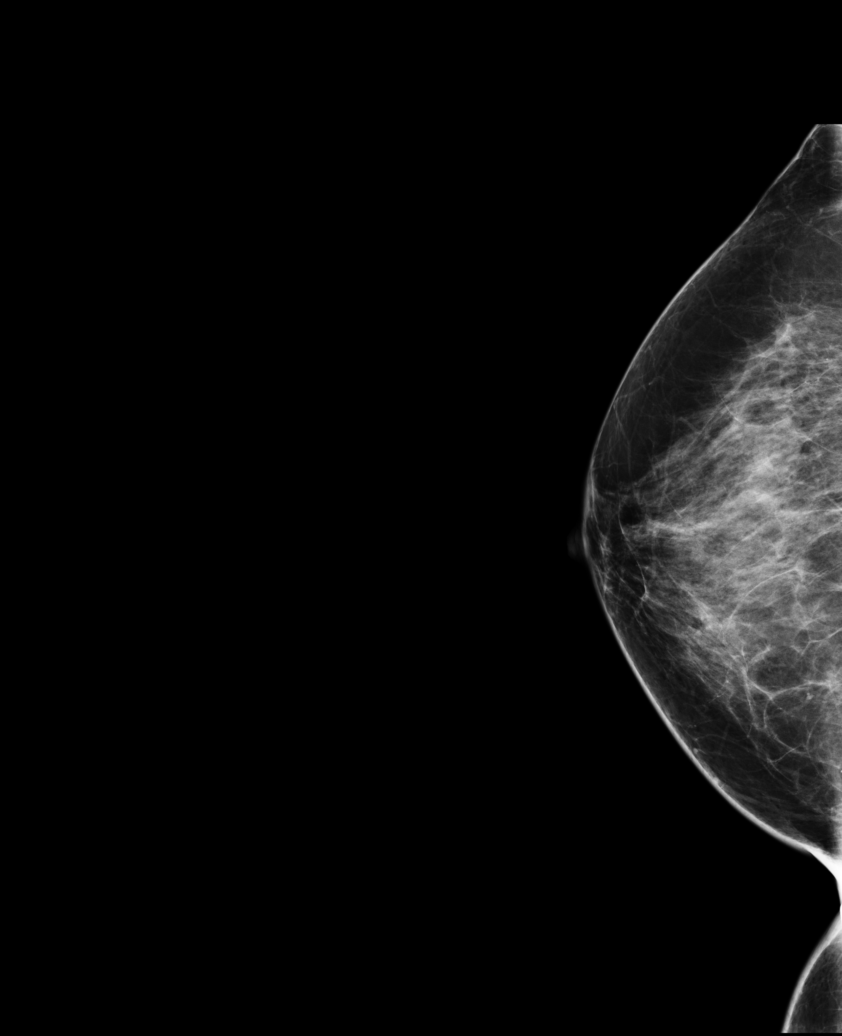

[R MLO (1 of 2)]
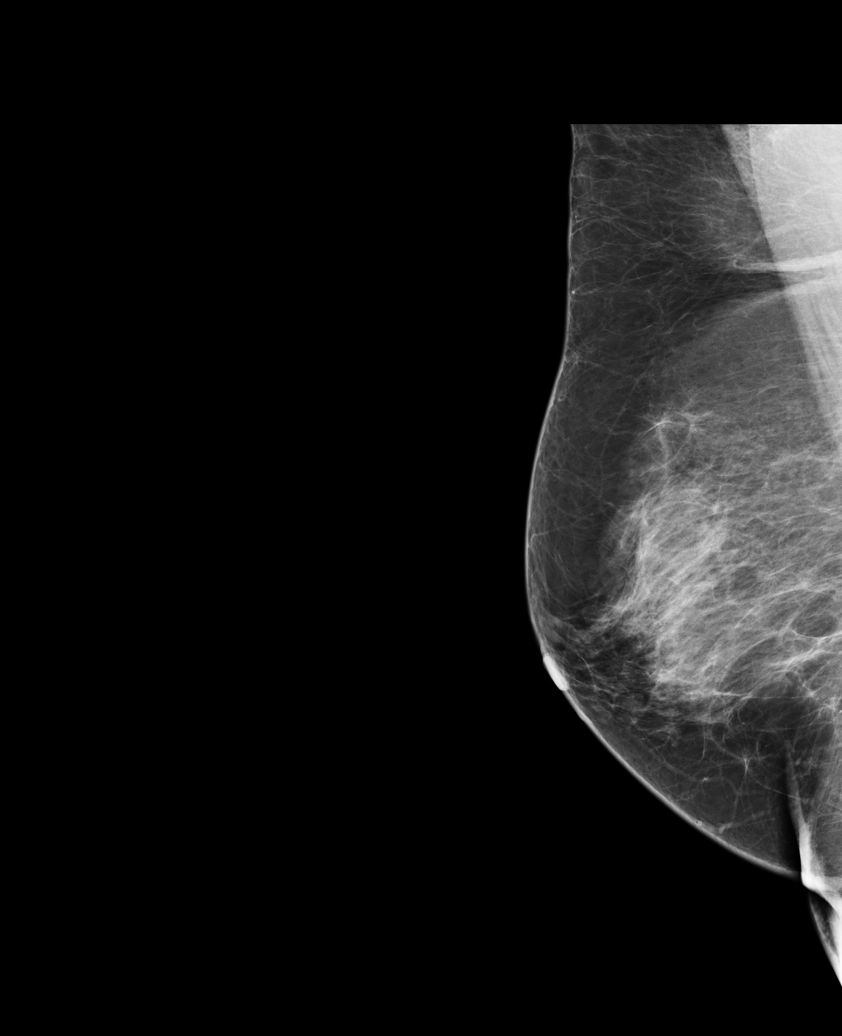

[R MLO (2 of 2)]
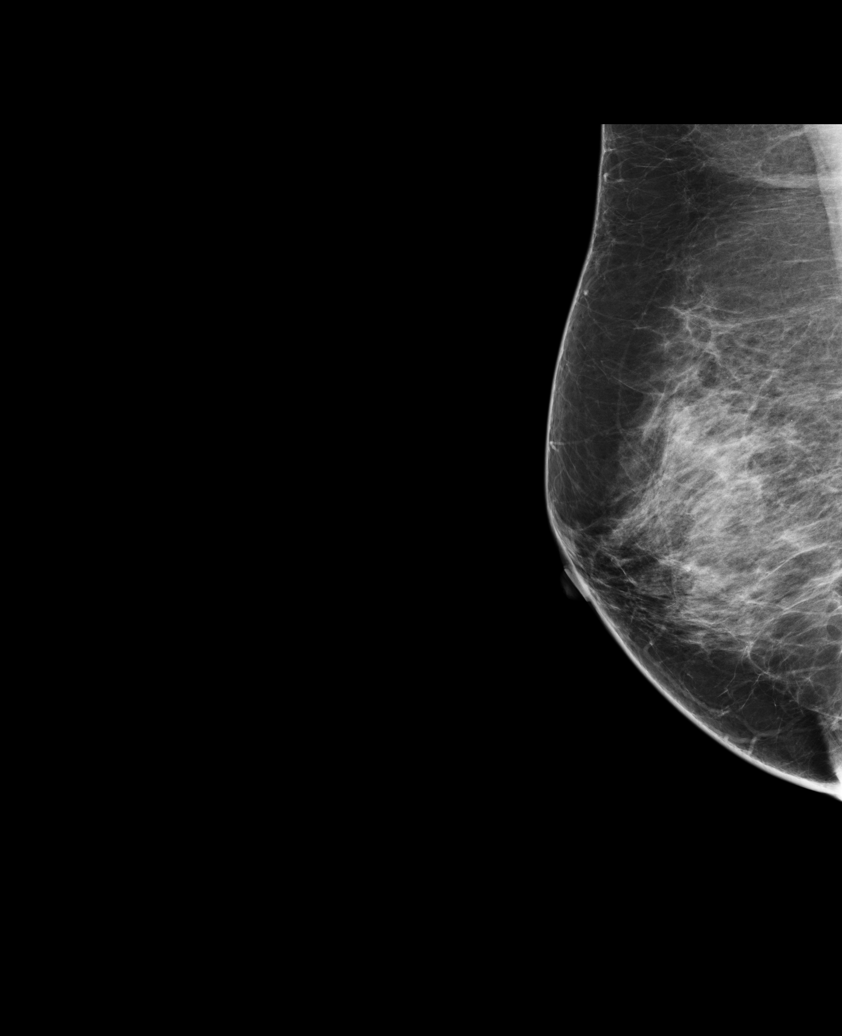

[5 of 5 positions shown; findings below may reference images not displayed]

The breast tissue is heterogeneously dense.  There is no dominant mass, architectural distortion or
calcification to suggest malignancy.

Images were processed with CAD.
IMPRESSION: No mammographic evidence of malignancy.  Suggest yearly screening mammography.

A result letter of this screening mammogram will be mailed directly to the patient.

ASSESSMENT: Negative - BI-RADS 1

Screening mammogram in 1 year.
,

## 2013-04-18 ENCOUNTER — Ambulatory Visit (HOSPITAL_COMMUNITY)
Admission: RE | Admit: 2013-04-18 | Discharge: 2013-04-18 | Disposition: A | Discharge status: Home / Self Care | Payer: Medicare Other | Source: Ambulatory Visit | Attending: Pulmonary Disease | Admitting: Pulmonary Disease

## 2013-04-18 DIAGNOSIS — Z139 Encounter for screening, unspecified: Secondary | ICD-10-CM

## 2013-04-18 DIAGNOSIS — Z1231 Encounter for screening mammogram for malignant neoplasm of breast: Secondary | ICD-10-CM | POA: Diagnosis not present

## 2013-04-23 ENCOUNTER — Other Ambulatory Visit: Payer: Self-pay | Admitting: Pulmonary Disease

## 2013-04-23 DIAGNOSIS — R928 Other abnormal and inconclusive findings on diagnostic imaging of breast: Secondary | ICD-10-CM

## 2013-05-08 DIAGNOSIS — M810 Age-related osteoporosis without current pathological fracture: Secondary | ICD-10-CM | POA: Diagnosis not present

## 2013-05-15 ENCOUNTER — Ambulatory Visit (HOSPITAL_COMMUNITY)
Admission: RE | Admit: 2013-05-15 | Discharge: 2013-05-15 | Disposition: A | Discharge status: Home / Self Care | Payer: Medicare Other | Source: Ambulatory Visit | Attending: Pulmonary Disease | Admitting: Pulmonary Disease

## 2013-05-15 DIAGNOSIS — R928 Other abnormal and inconclusive findings on diagnostic imaging of breast: Secondary | ICD-10-CM | POA: Diagnosis not present

## 2013-05-15 DIAGNOSIS — R922 Inconclusive mammogram: Secondary | ICD-10-CM | POA: Diagnosis not present

## 2013-06-05 DIAGNOSIS — J449 Chronic obstructive pulmonary disease, unspecified: Secondary | ICD-10-CM | POA: Diagnosis not present

## 2013-06-05 DIAGNOSIS — J019 Acute sinusitis, unspecified: Secondary | ICD-10-CM | POA: Diagnosis not present

## 2013-06-05 DIAGNOSIS — I10 Essential (primary) hypertension: Secondary | ICD-10-CM | POA: Diagnosis not present

## 2013-06-06 DIAGNOSIS — H348392 Tributary (branch) retinal vein occlusion, unspecified eye, stable: Secondary | ICD-10-CM | POA: Diagnosis not present

## 2013-06-06 DIAGNOSIS — H409 Unspecified glaucoma: Secondary | ICD-10-CM | POA: Diagnosis not present

## 2013-06-06 DIAGNOSIS — H4011X0 Primary open-angle glaucoma, stage unspecified: Secondary | ICD-10-CM | POA: Diagnosis not present

## 2013-06-24 DIAGNOSIS — H4011X0 Primary open-angle glaucoma, stage unspecified: Secondary | ICD-10-CM | POA: Diagnosis not present

## 2013-06-24 DIAGNOSIS — H3560 Retinal hemorrhage, unspecified eye: Secondary | ICD-10-CM | POA: Diagnosis not present

## 2013-06-24 DIAGNOSIS — H348392 Tributary (branch) retinal vein occlusion, unspecified eye, stable: Secondary | ICD-10-CM | POA: Diagnosis not present

## 2013-06-24 DIAGNOSIS — H409 Unspecified glaucoma: Secondary | ICD-10-CM | POA: Diagnosis not present

## 2013-06-24 DIAGNOSIS — H35359 Cystoid macular degeneration, unspecified eye: Secondary | ICD-10-CM | POA: Diagnosis not present

## 2013-07-30 DIAGNOSIS — L600 Ingrowing nail: Secondary | ICD-10-CM | POA: Diagnosis not present

## 2013-09-04 DIAGNOSIS — I10 Essential (primary) hypertension: Secondary | ICD-10-CM | POA: Diagnosis not present

## 2013-09-04 DIAGNOSIS — J449 Chronic obstructive pulmonary disease, unspecified: Secondary | ICD-10-CM | POA: Diagnosis not present

## 2013-09-04 DIAGNOSIS — M1990 Unspecified osteoarthritis, unspecified site: Secondary | ICD-10-CM | POA: Diagnosis not present

## 2013-09-04 DIAGNOSIS — K2100 Gastro-esophageal reflux disease with esophagitis, without bleeding: Secondary | ICD-10-CM | POA: Diagnosis not present

## 2013-09-13 DIAGNOSIS — L600 Ingrowing nail: Secondary | ICD-10-CM | POA: Diagnosis not present

## 2013-11-05 DIAGNOSIS — M1990 Unspecified osteoarthritis, unspecified site: Secondary | ICD-10-CM | POA: Diagnosis not present

## 2013-11-08 DIAGNOSIS — H409 Unspecified glaucoma: Secondary | ICD-10-CM | POA: Diagnosis not present

## 2013-11-08 DIAGNOSIS — H348392 Tributary (branch) retinal vein occlusion, unspecified eye, stable: Secondary | ICD-10-CM | POA: Diagnosis not present

## 2013-11-08 DIAGNOSIS — H4011X0 Primary open-angle glaucoma, stage unspecified: Secondary | ICD-10-CM | POA: Diagnosis not present

## 2013-12-26 DIAGNOSIS — H35359 Cystoid macular degeneration, unspecified eye: Secondary | ICD-10-CM | POA: Diagnosis not present

## 2013-12-26 DIAGNOSIS — H409 Unspecified glaucoma: Secondary | ICD-10-CM | POA: Diagnosis not present

## 2013-12-26 DIAGNOSIS — H348392 Tributary (branch) retinal vein occlusion, unspecified eye, stable: Secondary | ICD-10-CM | POA: Diagnosis not present

## 2013-12-26 DIAGNOSIS — H4011X0 Primary open-angle glaucoma, stage unspecified: Secondary | ICD-10-CM | POA: Diagnosis not present

## 2013-12-26 DIAGNOSIS — H3560 Retinal hemorrhage, unspecified eye: Secondary | ICD-10-CM | POA: Diagnosis not present

## 2014-01-03 DIAGNOSIS — H348392 Tributary (branch) retinal vein occlusion, unspecified eye, stable: Secondary | ICD-10-CM | POA: Diagnosis not present

## 2014-01-03 DIAGNOSIS — H409 Unspecified glaucoma: Secondary | ICD-10-CM | POA: Diagnosis not present

## 2014-01-03 DIAGNOSIS — H4011X0 Primary open-angle glaucoma, stage unspecified: Secondary | ICD-10-CM | POA: Diagnosis not present

## 2014-01-10 DIAGNOSIS — R7989 Other specified abnormal findings of blood chemistry: Secondary | ICD-10-CM | POA: Diagnosis not present

## 2014-01-10 DIAGNOSIS — J449 Chronic obstructive pulmonary disease, unspecified: Secondary | ICD-10-CM | POA: Diagnosis not present

## 2014-01-10 DIAGNOSIS — I10 Essential (primary) hypertension: Secondary | ICD-10-CM | POA: Diagnosis not present

## 2014-01-10 DIAGNOSIS — K2100 Gastro-esophageal reflux disease with esophagitis, without bleeding: Secondary | ICD-10-CM | POA: Diagnosis not present

## 2014-03-13 DIAGNOSIS — H4011X0 Primary open-angle glaucoma, stage unspecified: Secondary | ICD-10-CM | POA: Diagnosis not present

## 2014-03-13 DIAGNOSIS — H409 Unspecified glaucoma: Secondary | ICD-10-CM | POA: Diagnosis not present

## 2014-03-14 DIAGNOSIS — L600 Ingrowing nail: Secondary | ICD-10-CM | POA: Diagnosis not present

## 2014-03-31 DIAGNOSIS — Z23 Encounter for immunization: Secondary | ICD-10-CM | POA: Diagnosis not present

## 2014-04-07 ENCOUNTER — Other Ambulatory Visit (HOSPITAL_COMMUNITY): Payer: Self-pay | Admitting: Pulmonary Disease

## 2014-04-07 DIAGNOSIS — Z1231 Encounter for screening mammogram for malignant neoplasm of breast: Secondary | ICD-10-CM

## 2014-04-11 DIAGNOSIS — H9193 Unspecified hearing loss, bilateral: Secondary | ICD-10-CM | POA: Diagnosis not present

## 2014-04-11 DIAGNOSIS — I10 Essential (primary) hypertension: Secondary | ICD-10-CM | POA: Diagnosis not present

## 2014-04-11 DIAGNOSIS — Z23 Encounter for immunization: Secondary | ICD-10-CM | POA: Diagnosis not present

## 2014-04-11 DIAGNOSIS — M159 Polyosteoarthritis, unspecified: Secondary | ICD-10-CM | POA: Diagnosis not present

## 2014-04-11 DIAGNOSIS — J449 Chronic obstructive pulmonary disease, unspecified: Secondary | ICD-10-CM | POA: Diagnosis not present

## 2014-04-23 DIAGNOSIS — H4011X3 Primary open-angle glaucoma, severe stage: Secondary | ICD-10-CM | POA: Diagnosis not present

## 2014-04-28 ENCOUNTER — Ambulatory Visit (HOSPITAL_COMMUNITY)
Admission: RE | Admit: 2014-04-28 | Discharge: 2014-04-28 | Disposition: A | Discharge status: Home / Self Care | Payer: Medicare Other | Source: Ambulatory Visit | Attending: Pulmonary Disease | Admitting: Pulmonary Disease

## 2014-04-28 DIAGNOSIS — Z1231 Encounter for screening mammogram for malignant neoplasm of breast: Secondary | ICD-10-CM | POA: Diagnosis not present

## 2014-05-08 DIAGNOSIS — M159 Polyosteoarthritis, unspecified: Secondary | ICD-10-CM | POA: Diagnosis not present

## 2014-05-20 ENCOUNTER — Encounter (HOSPITAL_COMMUNITY): Payer: Self-pay | Admitting: *Deleted

## 2014-05-20 ENCOUNTER — Emergency Department (HOSPITAL_COMMUNITY): Payer: Medicare Other

## 2014-05-20 ENCOUNTER — Emergency Department (HOSPITAL_COMMUNITY)
Admission: EM | Admit: 2014-05-20 | Discharge: 2014-05-20 | Disposition: A | Discharge status: Home / Self Care | Payer: Medicare Other | Attending: Emergency Medicine | Admitting: Emergency Medicine

## 2014-05-20 DIAGNOSIS — Z79899 Other long term (current) drug therapy: Secondary | ICD-10-CM | POA: Diagnosis not present

## 2014-05-20 DIAGNOSIS — M79672 Pain in left foot: Secondary | ICD-10-CM | POA: Diagnosis not present

## 2014-05-20 DIAGNOSIS — M79673 Pain in unspecified foot: Secondary | ICD-10-CM

## 2014-05-20 DIAGNOSIS — I10 Essential (primary) hypertension: Secondary | ICD-10-CM | POA: Insufficient documentation

## 2014-05-20 DIAGNOSIS — J449 Chronic obstructive pulmonary disease, unspecified: Secondary | ICD-10-CM | POA: Insufficient documentation

## 2014-05-20 DIAGNOSIS — Z791 Long term (current) use of non-steroidal anti-inflammatories (NSAID): Secondary | ICD-10-CM | POA: Diagnosis not present

## 2014-05-20 DIAGNOSIS — M1990 Unspecified osteoarthritis, unspecified site: Secondary | ICD-10-CM | POA: Insufficient documentation

## 2014-05-20 DIAGNOSIS — M7732 Calcaneal spur, left foot: Secondary | ICD-10-CM | POA: Diagnosis not present

## 2014-05-20 HISTORY — DX: Chronic obstructive pulmonary disease, unspecified: J44.9

## 2014-05-20 MED ORDER — NAPROXEN 500 MG PO TABS: 500.0000 mg | ORAL_TABLET | Freq: Two times a day (BID) | ORAL | Status: DC

## 2014-05-20 MED ORDER — HYDROCODONE-ACETAMINOPHEN 5-325 MG PO TABS: 2.0000 | ORAL_TABLET | ORAL | Status: DC | PRN

## 2014-05-20 NOTE — ED Provider Notes (Signed)
CSN: [id number]     Arrival date & time 05/20/14  1251 History  This chart was scribed for Rolland PorterMark Averie Meiner, MD by Annye AsaAnna Dorsett, ED Scribe. This patient was seen in room APA10/APA10 and the patient's care was started at 2:22 PM.    Chief Complaint  Patient presents with  . Foot Pain   The history is provided by the patient. No language interpreter was used.    HPI Comments: [name] is a 79 y.o. female who presents to the Emergency Department complaining of 4 days of left foot pain; patient explains she went to bed the night before without issue and woke up the next morning with pain so severe she could "hardly walk." She describes her pain as located in her heel and sole of her left foot. Patient notes that she was on her feet more than her normal amount ("practically all day") the day before her symptoms began. During this activity, she was wearing flat-soled shoes that she wears semi-regularly. She denies trauma or injury to the area. She denies any other pain at present.   Past Medical History  Diagnosis Date  . Hypertension   . Arthritis   . COPD (chronic obstructive pulmonary disease)    Past Surgical History  Procedure Laterality Date  . Eye surgery     History reviewed. No pertinent family history. History  Substance Use Topics  . Smoking status: Former Games developermoker  . Smokeless tobacco: Not on file  . Alcohol Use: Yes   OB History    No data available     Review of Systems  Constitutional: Negative for fever, chills, diaphoresis, appetite change and fatigue.  HENT: Negative for mouth sores, sore throat and trouble swallowing.   Eyes: Negative for visual disturbance.  Respiratory: Negative for cough, chest tightness and wheezing.   Gastrointestinal: Negative for nausea, vomiting, diarrhea and abdominal distention.  Endocrine: Negative for polydipsia, polyphagia and polyuria.  Genitourinary: Negative for dysuria, frequency and hematuria.  Musculoskeletal: Negative for gait  problem.       Left foot pain  Skin: Negative for color change, pallor and rash.  Neurological: Negative for dizziness, syncope and light-headedness.  Hematological: Does not bruise/bleed easily.  Psychiatric/Behavioral: Negative for behavioral problems and confusion.    Allergies  Review of patient's allergies indicates no known allergies.  Home Medications   Prior to Admission medications   Medication Sig Start Date End Date Taking? Authorizing Provider  bimatoprost (LUMIGAN) 0.01 % SOLN Place 1 drop into the left eye at bedtime.   Yes Historical Provider, MD  brimonidine (ALPHAGAN) 0.2 % ophthalmic solution Place 1 drop into both eyes 2 (two) times daily.   Yes Historical Provider, MD  Calcium Carbonate-Vitamin D (CALCIUM 500 + D) 500-125 MG-UNIT TABS Take 1 tablet by mouth daily.   Yes Historical Provider, MD  dorzolamide-timolol (COSOPT) 22.3-6.8 MG/ML ophthalmic solution Place 1 drop into both eyes 2 (two) times daily.   Yes Historical Provider, MD  furosemide (LASIX) 40 MG tablet Take 40 mg by mouth daily.   Yes Historical Provider, MD  HYDROcodone-acetaminophen (NORCO/VICODIN) 5-325 MG per tablet Take 1 tablet by mouth every 6 (six) hours as needed (pain).   Yes Historical Provider, MD  losartan (COZAAR) 100 MG tablet Take 100 mg by mouth daily.   Yes Historical Provider, MD  Multiple Vitamin (MULTIVITAMIN WITH MINERALS) TABS Take 1 tablet by mouth daily.   Yes Historical Provider, MD  nabumetone (RELAFEN) 500 MG tablet Take 1,000 mg by mouth  daily.   Yes Historical Provider, MD  omega-3 acid ethyl esters (LOVAZA) 1 G capsule Take 1 g by mouth daily.   Yes Historical Provider, MD  omeprazole (PRILOSEC) 20 MG capsule Take 20 mg by mouth daily.   Yes Historical Provider, MD  raloxifene (EVISTA) 60 MG tablet Take 60 mg by mouth daily.   Yes Historical Provider, MD  tiotropium (SPIRIVA) 18 MCG inhalation capsule Place 18 mcg into inhaler and inhale daily.   Yes Historical Provider, MD   verapamil (VERELAN PM) 180 MG 24 hr capsule Take 360 mg by mouth at bedtime.   Yes Historical Provider, MD  denosumab (PROLIA) 60 MG/ML SOLN injection Inject 60 mg into the skin every 6 (six) months. Administer in upper arm, thigh, or abdomen    Historical Provider, MD  HYDROcodone-acetaminophen (NORCO/VICODIN) 5-325 MG per tablet Take 2 tablets by mouth every 4 (four) hours as needed. 05/20/14   Rolland PorterMark Mackensie Pilson, MD  naproxen (NAPROSYN) 500 MG tablet Take 1 tablet (500 mg total) by mouth 2 (two) times daily. 05/20/14   Rolland PorterMark Ignacia Gentzler, MD   BP 143/56 mmHg  Pulse 63  Temp(Src) 98.4 F (36.9 C) (Oral)  Resp 18  Ht 5\' 1"  (1.549 m)  Wt 158 lb (71.668 kg)  BMI 29.87 kg/m2  SpO2 100% Physical Exam  Constitutional: She is oriented to person, place, and time. She appears well-developed and well-nourished. No distress.  HENT:  Head: Normocephalic.  Eyes: Conjunctivae are normal. Pupils are equal, round, and reactive to light. No scleral icterus.  Neck: Normal range of motion. Neck supple. No thyromegaly present.  Cardiovascular: Normal rate and regular rhythm.  Exam reveals no gallop and no friction rub.   No murmur heard. Pulmonary/Chest: Effort normal and breath sounds normal. No respiratory distress. She has no wheezes. She has no rales.  Abdominal: Soft. Bowel sounds are normal. She exhibits no distension. There is no tenderness. There is no rebound.  Musculoskeletal: Normal range of motion.  No swelling, no tenderness over the achilles; not red, not swollen, but tender over the lateral medial side of the sole   Neurological: She is alert and oriented to person, place, and time.  Skin: Skin is warm and dry. No rash noted.  Psychiatric: She has a normal mood and affect. Her behavior is normal.    ED Course  Procedures   DIAGNOSTIC STUDIES: Oxygen Saturation is 100% on RA, normal by my interpretation.    COORDINATION OF CARE: 2:28 PM Discussed treatment plan with pt at bedside and pt agreed to  plan.   Labs Review Labs Reviewed - No data to display  Imaging Review Dg Ankle Complete Left  05/20/2014   CLINICAL DATA:  Left-sided foot and ankle pain since last Friday predominantly in the heel when walking ; no known injury, mild swelling  EXAM: LEFT ANKLE COMPLETE - 3+ VIEW  COMPARISON:  Left foot series of today's date and calcaneal series of March 04, 2010  FINDINGS: The ankle joint mortise is preserved. The talar dome is intact. There is no acute malleolar fracture. There is a small spur from the plantar and Achilles regions of the calcaneus. There is mild soft tissue swelling diffusely.  IMPRESSION: There is no acute or significant chronic bony abnormality of the left ankle. There are small calcaneal spurs.   Electronically Signed   By: David  [place]az[place]   On: 05/20/2014 15:12   Dg Foot Complete Left  05/20/2014   CLINICAL DATA:  Left foot and ankle pain  since Friday.  EXAM: LEFT FOOT - COMPLETE 3+ VIEW  COMPARISON:  05/20/2014  FINDINGS: Normal alignment without acute fracture or osseous abnormality. Preserved joint spaces. No significant arthropathy. No definite soft tissue abnormality.  IMPRESSION: No acute osseous finding   Electronically Signed   By: Ruel Favors M.D.   On: 05/20/2014 15:12     EKG Interpretation None      MDM   Final diagnoses:  Heel pain    Normal exam, save tenderness on anterior heel pan.  No plantar fasciitis on stretch, not red/inflamed, normal radiographs.  Only potential inciting event was her wearing her slipper like she is without support for Thursday and Friday. I asked her to use a supportive shoe with an arch. Prescription for Naprosyn and Vicodin. Primary care follow-up if not improving.    Rolland Porter, MD 05/20/14 [phone]

## 2014-05-20 NOTE — ED Notes (Signed)
Pt with left foot pain to bottom mid foot, good pedal pulse noted to foot

## 2014-05-20 NOTE — ED Notes (Signed)
Lt foot pain and swelling, no injury

## 2014-05-20 NOTE — Discharge Instructions (Signed)
Wear a shoe that has an arch support.  Medication prescriptions as prescribed.  Recheck with your PCP if not improving.

## 2014-05-20 NOTE — ED Notes (Signed)
Pt verbalized understanding of no driving and to use caution within 4 hours of taking pain meds due to meds cause drowsiness 

## 2014-05-23 DIAGNOSIS — H34831 Tributary (branch) retinal vein occlusion, right eye: Secondary | ICD-10-CM | POA: Diagnosis not present

## 2014-05-23 DIAGNOSIS — H4011X3 Primary open-angle glaucoma, severe stage: Secondary | ICD-10-CM | POA: Diagnosis not present

## 2014-05-31 ENCOUNTER — Emergency Department (HOSPITAL_COMMUNITY)
Admission: EM | Admit: 2014-05-31 | Discharge: 2014-05-31 | Disposition: A | Discharge status: Home / Self Care | Payer: Medicare Other | Attending: Emergency Medicine | Admitting: Emergency Medicine

## 2014-05-31 ENCOUNTER — Encounter (HOSPITAL_COMMUNITY): Payer: Self-pay | Admitting: Emergency Medicine

## 2014-05-31 DIAGNOSIS — I10 Essential (primary) hypertension: Secondary | ICD-10-CM | POA: Diagnosis not present

## 2014-05-31 DIAGNOSIS — J449 Chronic obstructive pulmonary disease, unspecified: Secondary | ICD-10-CM | POA: Diagnosis not present

## 2014-05-31 DIAGNOSIS — Z791 Long term (current) use of non-steroidal anti-inflammatories (NSAID): Secondary | ICD-10-CM | POA: Insufficient documentation

## 2014-05-31 DIAGNOSIS — R531 Weakness: Secondary | ICD-10-CM | POA: Diagnosis not present

## 2014-05-31 DIAGNOSIS — M6281 Muscle weakness (generalized): Secondary | ICD-10-CM | POA: Insufficient documentation

## 2014-05-31 DIAGNOSIS — Z79899 Other long term (current) drug therapy: Secondary | ICD-10-CM | POA: Insufficient documentation

## 2014-05-31 DIAGNOSIS — Z87891 Personal history of nicotine dependence: Secondary | ICD-10-CM | POA: Diagnosis not present

## 2014-05-31 DIAGNOSIS — M1990 Unspecified osteoarthritis, unspecified site: Secondary | ICD-10-CM | POA: Insufficient documentation

## 2014-05-31 LAB — CBC
HCT: 35.3 % — ABNORMAL LOW (ref 36.0–46.0)
Hemoglobin: 11.4 g/dL — ABNORMAL LOW (ref 12.0–15.0)
MCH: 25.7 pg — AB (ref 26.0–34.0)
MCHC: 32.3 g/dL (ref 30.0–36.0)
MCV: 79.5 fL (ref 78.0–100.0)
PLATELETS: 215 10*3/uL (ref 150–400)
RBC: 4.44 MIL/uL (ref 3.87–5.11)
RDW: 15.8 % — ABNORMAL HIGH (ref 11.5–15.5)
WBC: 7 10*3/uL (ref 4.0–10.5)

## 2014-05-31 LAB — BASIC METABOLIC PANEL
ANION GAP: 11 (ref 5–15)
BUN: 19 mg/dL (ref 6–23)
CALCIUM: 9.4 mg/dL (ref 8.4–10.5)
CO2: 26 mEq/L (ref 19–32)
Chloride: 108 mEq/L (ref 96–112)
Creatinine, Ser: 1.15 mg/dL — ABNORMAL HIGH (ref 0.50–1.10)
GFR calc Af Amer: 52 mL/min — ABNORMAL LOW (ref >90)
GFR, EST NON AFRICAN AMERICAN: 45 mL/min — AB (ref >90)
Glucose, Bld: 101 mg/dL — ABNORMAL HIGH (ref 70–99)
POTASSIUM: 4.1 meq/L (ref 3.7–5.3)
SODIUM: 145 meq/L (ref 137–147)

## 2014-05-31 NOTE — ED Notes (Signed)
Pt states she was raking leaves this morning and felt "weak in the legs" said she could hardly make it to the mailbox this morning as well. C/o swelling to bilateral lower extremities.

## 2014-05-31 NOTE — ED Provider Notes (Signed)
CSN: [id number]     Arrival date & time 05/31/14  1710 History   First MD Initiated Contact with Patient 05/31/14 1839     Chief Complaint  Patient presents with  . Extremity Weakness      HPI Patient reports mild swelling in both of her legs over the past 24 hours and reports intermittent episodes of "weakness of both legs".  Is not isolated to one leg.  No history DVT or pulmonary embolism.  She is on Lasix.  She reports no weakness at this time.  She walked in to the emergency department.  She denies chest pain shortness of breath.  No abdominal pain.  No fevers or chills.  No rash.  No other complaints.  No numbness.  No weakness in her arms.  No urinary complaints   Past Medical History  Diagnosis Date  . Hypertension   . Arthritis   . COPD (chronic obstructive pulmonary disease)    Past Surgical History  Procedure Laterality Date  . Eye surgery     History reviewed. No pertinent family history. History  Substance Use Topics  . Smoking status: Former Games developermoker  . Smokeless tobacco: Not on file  . Alcohol Use: Yes   OB History    No data available     Review of Systems  All other systems reviewed and are negative.     Allergies  Review of patient's allergies indicates no known allergies.  Home Medications   Prior to Admission medications   Medication Sig Start Date End Date Taking? Authorizing Provider  bimatoprost (LUMIGAN) 0.01 % SOLN Place 1 drop into the left eye at bedtime.    Historical Provider, MD  brimonidine (ALPHAGAN) 0.2 % ophthalmic solution Place 1 drop into both eyes 2 (two) times daily.    Historical Provider, MD  Calcium Carbonate-Vitamin D (CALCIUM 500 + D) 500-125 MG-UNIT TABS Take 1 tablet by mouth daily.    Historical Provider, MD  denosumab (PROLIA) 60 MG/ML SOLN injection Inject 60 mg into the skin every 6 (six) months. Administer in upper arm, thigh, or abdomen    Historical Provider, MD  dorzolamide-timolol (COSOPT) 22.3-6.8 MG/ML  ophthalmic solution Place 1 drop into both eyes 2 (two) times daily.    Historical Provider, MD  furosemide (LASIX) 40 MG tablet Take 40 mg by mouth daily.    Historical Provider, MD  HYDROcodone-acetaminophen (NORCO/VICODIN) 5-325 MG per tablet Take 1 tablet by mouth every 6 (six) hours as needed (pain).    Historical Provider, MD  HYDROcodone-acetaminophen (NORCO/VICODIN) 5-325 MG per tablet Take 2 tablets by mouth every 4 (four) hours as needed. 05/20/14   Rolland PorterMark James, MD  losartan (COZAAR) 100 MG tablet Take 100 mg by mouth daily.    Historical Provider, MD  Multiple Vitamin (MULTIVITAMIN WITH MINERALS) TABS Take 1 tablet by mouth daily.    Historical Provider, MD  nabumetone (RELAFEN) 500 MG tablet Take 1,000 mg by mouth daily.    Historical Provider, MD  naproxen (NAPROSYN) 500 MG tablet Take 1 tablet (500 mg total) by mouth 2 (two) times daily. 05/20/14   Rolland PorterMark James, MD  omega-3 acid ethyl esters (LOVAZA) 1 G capsule Take 1 g by mouth daily.    Historical Provider, MD  omeprazole (PRILOSEC) 20 MG capsule Take 20 mg by mouth daily.    Historical Provider, MD  raloxifene (EVISTA) 60 MG tablet Take 60 mg by mouth daily.    Historical Provider, MD  tiotropium (SPIRIVA) 18 MCG inhalation capsule Place  18 mcg into inhaler and inhale daily.    Historical Provider, MD  verapamil (VERELAN PM) 180 MG 24 hr capsule Take 360 mg by mouth at bedtime.    Historical Provider, MD   BP 157/92 mmHg  Pulse 64  Temp(Src) 98.8 F (37.1 C) (Oral)  Resp 16  Ht 5\' 2"  (1.575 m)  Wt 158 lb (71.668 kg)  BMI 28.89 kg/m2  SpO2 96% Physical Exam  Constitutional: She is oriented to person, place, and time. She appears well-developed and well-nourished. No distress.  HENT:  Head: Normocephalic and atraumatic.  Eyes: EOM are normal.  Neck: Normal range of motion.  Cardiovascular: Normal rate, regular rhythm and normal heart sounds.   Pulmonary/Chest: Effort normal and breath sounds normal.  Abdominal: Soft. She  exhibits no distension. There is no tenderness.  Musculoskeletal: Normal range of motion. She exhibits no edema or tenderness.  Full range of motion bilateral hips knees and ankles.  5 out of 5 strength in major muscle groups of bilateral lower extremities.  Normal PT and DP pulses bilaterally.  Neurological: She is alert and oriented to person, place, and time.  Skin: Skin is warm and dry.  Psychiatric: She has a normal mood and affect. Judgment normal.  Nursing note and vitals reviewed.   ED Course  Procedures (including critical care time) Labs Review Labs Reviewed  CBC - Abnormal; Notable for the following:    Hemoglobin 11.4 (*)    HCT 35.3 (*)    MCH 25.7 (*)    RDW 15.8 (*)    All other components within normal limits  BASIC METABOLIC PANEL - Abnormal; Notable for the following:    Glucose, Bld 101 (*)    Creatinine, Ser 1.15 (*)    GFR calc non Af Amer 45 (*)    GFR calc Af Amer 52 (*)    All other components within normal limits  TSH    Imaging Review No results found.   EKG Interpretation None      MDM   Final diagnoses:  Weakness    Patient is ambulatory.  She has normal pulses.  Doubt DVT.  No signs of infection.  Abdomen is benign.  Discharge home in good condition.    Lyanne CoKevin M Kanna Dafoe, MD 05/31/14 [phone]

## 2014-06-01 LAB — TSH: TSH: 6.12 u[IU]/mL — AB (ref 0.350–4.500)

## 2014-06-02 DIAGNOSIS — R531 Weakness: Secondary | ICD-10-CM | POA: Diagnosis not present

## 2014-06-02 DIAGNOSIS — J449 Chronic obstructive pulmonary disease, unspecified: Secondary | ICD-10-CM | POA: Diagnosis not present

## 2014-06-02 DIAGNOSIS — I10 Essential (primary) hypertension: Secondary | ICD-10-CM | POA: Diagnosis not present

## 2014-06-26 DIAGNOSIS — H34831 Tributary (branch) retinal vein occlusion, right eye: Secondary | ICD-10-CM | POA: Diagnosis not present

## 2014-06-26 DIAGNOSIS — H4011X3 Primary open-angle glaucoma, severe stage: Secondary | ICD-10-CM | POA: Diagnosis not present

## 2014-08-12 DIAGNOSIS — J449 Chronic obstructive pulmonary disease, unspecified: Secondary | ICD-10-CM | POA: Diagnosis not present

## 2014-08-12 DIAGNOSIS — M159 Polyosteoarthritis, unspecified: Secondary | ICD-10-CM | POA: Diagnosis not present

## 2014-08-12 DIAGNOSIS — K210 Gastro-esophageal reflux disease with esophagitis: Secondary | ICD-10-CM | POA: Diagnosis not present

## 2014-08-12 DIAGNOSIS — I10 Essential (primary) hypertension: Secondary | ICD-10-CM | POA: Diagnosis not present

## 2014-09-05 DIAGNOSIS — L600 Ingrowing nail: Secondary | ICD-10-CM | POA: Diagnosis not present

## 2014-09-05 DIAGNOSIS — M79674 Pain in right toe(s): Secondary | ICD-10-CM | POA: Diagnosis not present

## 2014-10-02 DIAGNOSIS — H4011X3 Primary open-angle glaucoma, severe stage: Secondary | ICD-10-CM | POA: Diagnosis not present

## 2014-10-02 DIAGNOSIS — Z87891 Personal history of nicotine dependence: Secondary | ICD-10-CM | POA: Diagnosis not present

## 2014-10-02 DIAGNOSIS — H34831 Tributary (branch) retinal vein occlusion, right eye: Secondary | ICD-10-CM | POA: Diagnosis not present

## 2014-10-03 DIAGNOSIS — M79674 Pain in right toe(s): Secondary | ICD-10-CM | POA: Diagnosis not present

## 2014-11-06 DIAGNOSIS — M159 Polyosteoarthritis, unspecified: Secondary | ICD-10-CM | POA: Diagnosis not present

## 2014-11-28 DIAGNOSIS — M79674 Pain in right toe(s): Secondary | ICD-10-CM | POA: Diagnosis not present

## 2014-12-04 DIAGNOSIS — H4011X3 Primary open-angle glaucoma, severe stage: Secondary | ICD-10-CM | POA: Diagnosis not present

## 2014-12-04 DIAGNOSIS — H34831 Tributary (branch) retinal vein occlusion, right eye: Secondary | ICD-10-CM | POA: Diagnosis not present

## 2014-12-11 DIAGNOSIS — M6281 Muscle weakness (generalized): Secondary | ICD-10-CM | POA: Diagnosis not present

## 2014-12-11 DIAGNOSIS — R079 Chest pain, unspecified: Secondary | ICD-10-CM | POA: Diagnosis not present

## 2014-12-11 DIAGNOSIS — J449 Chronic obstructive pulmonary disease, unspecified: Secondary | ICD-10-CM | POA: Diagnosis not present

## 2014-12-11 DIAGNOSIS — I10 Essential (primary) hypertension: Secondary | ICD-10-CM | POA: Diagnosis not present

## 2014-12-12 DIAGNOSIS — R079 Chest pain, unspecified: Secondary | ICD-10-CM | POA: Diagnosis not present

## 2014-12-12 DIAGNOSIS — M6281 Muscle weakness (generalized): Secondary | ICD-10-CM | POA: Diagnosis not present

## 2014-12-12 DIAGNOSIS — I10 Essential (primary) hypertension: Secondary | ICD-10-CM | POA: Diagnosis not present

## 2014-12-12 DIAGNOSIS — J449 Chronic obstructive pulmonary disease, unspecified: Secondary | ICD-10-CM | POA: Diagnosis not present

## 2015-02-05 DIAGNOSIS — H4011X3 Primary open-angle glaucoma, severe stage: Secondary | ICD-10-CM | POA: Diagnosis not present

## 2015-03-23 ENCOUNTER — Other Ambulatory Visit (HOSPITAL_COMMUNITY): Payer: Self-pay | Admitting: Pulmonary Disease

## 2015-03-23 DIAGNOSIS — Z1231 Encounter for screening mammogram for malignant neoplasm of breast: Secondary | ICD-10-CM

## 2015-04-01 DIAGNOSIS — I10 Essential (primary) hypertension: Secondary | ICD-10-CM | POA: Diagnosis not present

## 2015-04-01 DIAGNOSIS — H401133 Primary open-angle glaucoma, bilateral, severe stage: Secondary | ICD-10-CM | POA: Diagnosis not present

## 2015-04-01 DIAGNOSIS — H348312 Tributary (branch) retinal vein occlusion, right eye, stable: Secondary | ICD-10-CM | POA: Diagnosis not present

## 2015-04-01 DIAGNOSIS — H35351 Cystoid macular degeneration, right eye: Secondary | ICD-10-CM | POA: Diagnosis not present

## 2015-04-01 DIAGNOSIS — Z87891 Personal history of nicotine dependence: Secondary | ICD-10-CM | POA: Diagnosis not present

## 2015-04-13 DIAGNOSIS — I10 Essential (primary) hypertension: Secondary | ICD-10-CM | POA: Diagnosis not present

## 2015-04-13 DIAGNOSIS — J449 Chronic obstructive pulmonary disease, unspecified: Secondary | ICD-10-CM | POA: Diagnosis not present

## 2015-04-13 DIAGNOSIS — Z23 Encounter for immunization: Secondary | ICD-10-CM | POA: Diagnosis not present

## 2015-04-13 DIAGNOSIS — R601 Generalized edema: Secondary | ICD-10-CM | POA: Diagnosis not present

## 2015-04-13 DIAGNOSIS — R358 Other polyuria: Secondary | ICD-10-CM | POA: Diagnosis not present

## 2015-04-14 DIAGNOSIS — J449 Chronic obstructive pulmonary disease, unspecified: Secondary | ICD-10-CM | POA: Diagnosis not present

## 2015-04-14 DIAGNOSIS — I10 Essential (primary) hypertension: Secondary | ICD-10-CM | POA: Diagnosis not present

## 2015-04-14 DIAGNOSIS — R358 Other polyuria: Secondary | ICD-10-CM | POA: Diagnosis not present

## 2015-04-17 DIAGNOSIS — Z87891 Personal history of nicotine dependence: Secondary | ICD-10-CM | POA: Diagnosis not present

## 2015-04-17 DIAGNOSIS — Z961 Presence of intraocular lens: Secondary | ICD-10-CM | POA: Diagnosis not present

## 2015-04-17 DIAGNOSIS — Z79899 Other long term (current) drug therapy: Secondary | ICD-10-CM | POA: Diagnosis not present

## 2015-04-17 DIAGNOSIS — H401133 Primary open-angle glaucoma, bilateral, severe stage: Secondary | ICD-10-CM | POA: Diagnosis not present

## 2015-05-04 ENCOUNTER — Ambulatory Visit (HOSPITAL_COMMUNITY)
Admission: RE | Admit: 2015-05-04 | Discharge: 2015-05-04 | Disposition: A | Discharge status: Home / Self Care | Payer: Medicare Other | Source: Ambulatory Visit | Attending: Pulmonary Disease | Admitting: Pulmonary Disease

## 2015-05-04 DIAGNOSIS — Z1231 Encounter for screening mammogram for malignant neoplasm of breast: Secondary | ICD-10-CM

## 2015-05-08 DIAGNOSIS — M810 Age-related osteoporosis without current pathological fracture: Secondary | ICD-10-CM | POA: Diagnosis not present

## 2015-06-09 ENCOUNTER — Encounter (HOSPITAL_COMMUNITY): Payer: Self-pay | Admitting: Emergency Medicine

## 2015-06-09 ENCOUNTER — Emergency Department (HOSPITAL_COMMUNITY): Payer: Medicare Other

## 2015-06-09 ENCOUNTER — Emergency Department (HOSPITAL_COMMUNITY)
Admission: EM | Admit: 2015-06-09 | Discharge: 2015-06-09 | Disposition: A | Discharge status: Home / Self Care | Payer: Medicare Other | Attending: Emergency Medicine | Admitting: Emergency Medicine

## 2015-06-09 DIAGNOSIS — N644 Mastodynia: Secondary | ICD-10-CM | POA: Insufficient documentation

## 2015-06-09 DIAGNOSIS — Z87891 Personal history of nicotine dependence: Secondary | ICD-10-CM | POA: Diagnosis not present

## 2015-06-09 DIAGNOSIS — Z79899 Other long term (current) drug therapy: Secondary | ICD-10-CM | POA: Insufficient documentation

## 2015-06-09 DIAGNOSIS — I10 Essential (primary) hypertension: Secondary | ICD-10-CM | POA: Diagnosis not present

## 2015-06-09 DIAGNOSIS — Z791 Long term (current) use of non-steroidal anti-inflammatories (NSAID): Secondary | ICD-10-CM | POA: Insufficient documentation

## 2015-06-09 DIAGNOSIS — R079 Chest pain, unspecified: Secondary | ICD-10-CM | POA: Diagnosis not present

## 2015-06-09 DIAGNOSIS — M1990 Unspecified osteoarthritis, unspecified site: Secondary | ICD-10-CM | POA: Insufficient documentation

## 2015-06-09 DIAGNOSIS — J449 Chronic obstructive pulmonary disease, unspecified: Secondary | ICD-10-CM | POA: Diagnosis not present

## 2015-06-09 NOTE — Discharge Instructions (Signed)
Nonspecific Chest Pain  °Chest pain can be caused by many different conditions. There is always a chance that your pain could be related to something serious, such as a heart attack or a blood clot in your lungs. Chest pain can also be caused by conditions that are not life-threatening. If you have chest pain, it is very important to follow up with your health care provider. °CAUSES  °Chest pain can be caused by: °· Heartburn. °· Pneumonia or bronchitis. °· Anxiety or stress. °· Inflammation around your heart (pericarditis) or lung (pleuritis or pleurisy). °· A blood clot in your lung. °· A collapsed lung (pneumothorax). It can develop suddenly on its own (spontaneous pneumothorax) or from trauma to the chest. °· Shingles infection (varicella-zoster virus). °· Heart attack. °· Damage to the bones, muscles, and cartilage that make up your chest wall. This can include: °¨ Bruised bones due to injury. °¨ Strained muscles or cartilage due to frequent or repeated coughing or overwork. °¨ Fracture to one or more ribs. °¨ Sore cartilage due to inflammation (costochondritis). °RISK FACTORS  °Risk factors for chest pain may include: °· Activities that increase your risk for trauma or injury to your chest. °· Respiratory infections or conditions that cause frequent coughing. °· Medical conditions or overeating that can cause heartburn. °· Heart disease or family history of heart disease. °· Conditions or health behaviors that increase your risk of developing a blood clot. °· Having had chicken pox (varicella zoster). °SIGNS AND SYMPTOMS °Chest pain can feel like: °· Burning or tingling on the surface of your chest or deep in your chest. °· Crushing, pressure, aching, or squeezing pain. °· Dull or sharp pain that is worse when you move, cough, or take a deep breath. °· Pain that is also felt in your back, neck, shoulder, or arm, or pain that spreads to any of these areas. °Your chest pain may come and go, or it may stay  constant. °DIAGNOSIS °Lab tests or other studies may be needed to find the cause of your pain. Your health care provider may have you take a test called an ambulatory ECG (electrocardiogram). An ECG records your heartbeat patterns at the time the test is performed. You may also have other tests, such as: °· Transthoracic echocardiogram (TTE). During echocardiography, sound waves are used to create a picture of all of the heart structures and to look at how blood flows through your heart. °· Transesophageal echocardiogram (TEE). This is a more advanced imaging test that obtains images from inside your body. It allows your health care provider to see your heart in finer detail. °· Cardiac monitoring. This allows your health care provider to monitor your heart rate and rhythm in real time. °· Holter monitor. This is a portable device that records your heartbeat and can help to diagnose abnormal heartbeats. It allows your health care provider to track your heart activity for several days, if needed. °· Stress tests. These can be done through exercise or by taking medicine that makes your heart beat more quickly. °· Blood tests. °· Imaging tests. °TREATMENT  °Your treatment depends on what is causing your chest pain. Treatment may include: °· Medicines. These may include: °¨ Acid blockers for heartburn. °¨ Anti-inflammatory medicine. °¨ Pain medicine for inflammatory conditions. °¨ Antibiotic medicine, if an infection is present. °¨ Medicines to dissolve blood clots. °¨ Medicines to treat coronary artery disease. °· Supportive care for conditions that do not require medicines. This may include: °¨ Resting. °¨ Applying heat   or cold packs to injured areas. °¨ Limiting activities until pain decreases. °HOME CARE INSTRUCTIONS °· If you were prescribed an antibiotic medicine, finish it all even if you start to feel better. °· Avoid any activities that bring on chest pain. °· Do not use any tobacco products, including  cigarettes, chewing tobacco, or electronic cigarettes. If you need help quitting, ask your health care provider. °· Do not drink alcohol. °· Take medicines only as directed by your health care provider. °· Keep all follow-up visits as directed by your health care provider. This is important. This includes any further testing if your chest pain does not go away. °· If heartburn is the cause for your chest pain, you may be told to keep your head raised (elevated) while sleeping. This reduces the chance that acid will go from your stomach into your esophagus. °· Make lifestyle changes as directed by your health care provider. These may include: °¨ Getting regular exercise. Ask your health care provider to suggest some activities that are safe for you. °¨ Eating a heart-healthy diet. A registered dietitian can help you to learn healthy eating options. °¨ Maintaining a healthy weight. °¨ Managing diabetes, if necessary. °¨ Reducing stress. °SEEK MEDICAL CARE IF: °· Your chest pain does not go away after treatment. °· You have a rash with blisters on your chest. °· You have a fever. °SEEK IMMEDIATE MEDICAL CARE IF:  °· Your chest pain is worse. °· You have an increasing cough, or you cough up blood. °· You have severe abdominal pain. °· You have severe weakness. °· You faint. °· You have chills. °· You have sudden, unexplained chest discomfort. °· You have sudden, unexplained discomfort in your arms, back, neck, or jaw. °· You have shortness of breath at any time. °· You suddenly start to sweat, or your skin gets clammy. °· You feel nauseous or you vomit. °· You suddenly feel light-headed or dizzy. °· Your heart begins to beat quickly, or it feels like it is skipping beats. °These symptoms may represent a serious problem that is an emergency. Do not wait to see if the symptoms will go away. Get medical help right away. Call your local emergency services (911 in the [place] Do not drive yourself to the hospital. °  °This  information is not intended to replace advice given to you by your health care provider. Make sure you discuss any questions you have with your health care provider. °  °Document Released: 03/16/2005 Document Revised: 06/27/2014 Document Reviewed: 01/10/2014 °Elsevier Interactive Patient Education ©2016 [employer] ° °

## 2015-06-09 NOTE — ED Notes (Signed)
Having pain to left breast for one year.  C/o pain  On Sunday, rates pain 9/10, but currently not having any pain.  On Sunday it was a sharp shooting pain.  Did have pain last night about 1900, rating pain 4/10.

## 2015-06-10 NOTE — ED Provider Notes (Signed)
CSN: [id number]     Arrival date & time 06/09/15  1032 History   First MD Initiated Contact with Patient 06/09/15 1056     Chief Complaint  Patient presents with  . Breast Pain    left     (Consider location/radiation/quality/duration/timing/severity/associated sxs/prior Treatment) The history is provided by the patient.   [name] is a 80 y.o. female with a history significant for HTN and copd presenting with a nearly one year history of left sided breast pain, described as sharp, stabbing and lasting less than 1 minute and occurs randomly, not associated with rest, exertion, deep inspiration or any specific movements.  She had a bad episode 2 days ago, rated at 9/10 pain, lasted about 30 seconds, then a milder pain last night, also lasting briefly while watching tv.  It is not accompanied by shortness of breath, nausea, diaphoresis or palpitation. She saw her pcp for this months ago and an ekg performed was negative.  She is utd with her mammograms, having a normal study last month.  She denies breast mass, nipple discharge or any other changes.     Past Medical History  Diagnosis Date  . Hypertension   . Arthritis   . COPD (chronic obstructive pulmonary disease) [facility])    Past Surgical History  Procedure Laterality Date  . Eye surgery     History reviewed. No pertinent family history. Social History  Substance Use Topics  . Smoking status: Former Games developer  . Smokeless tobacco: None  . Alcohol Use: Yes   OB History    No data available     Review of Systems  Constitutional: Negative for fever.  HENT: Negative for congestion and sore throat.   Eyes: Negative.   Respiratory: Negative for chest tightness and shortness of breath.   Cardiovascular: Positive for chest pain. Negative for palpitations and leg swelling.  Gastrointestinal: Negative for nausea and abdominal pain.  Genitourinary: Negative.   Musculoskeletal: Negative for joint swelling, arthralgias and neck  pain.  Skin: Negative.  Negative for rash and wound.  Neurological: Negative for dizziness, weakness, light-headedness, numbness and headaches.      Allergies  Review of patient's allergies indicates no known allergies.  Home Medications   Prior to Admission medications   Medication Sig Start Date End Date Taking? Authorizing Provider  bimatoprost (LUMIGAN) 0.01 % SOLN Place 1 drop into the left eye at bedtime.    Historical Provider, MD  brimonidine (ALPHAGAN) 0.2 % ophthalmic solution Place 1 drop into both eyes 2 (two) times daily.    Historical Provider, MD  Calcium Carbonate-Vitamin D (CALCIUM 500 + D) 500-125 MG-UNIT TABS Take 1 tablet by mouth daily.    Historical Provider, MD  denosumab (PROLIA) 60 MG/ML SOLN injection Inject 60 mg into the skin every 6 (six) months. Administer in upper arm, thigh, or abdomen    Historical Provider, MD  dorzolamide-timolol (COSOPT) 22.3-6.8 MG/ML ophthalmic solution Place 1 drop into both eyes 2 (two) times daily.    Historical Provider, MD  furosemide (LASIX) 40 MG tablet Take 40 mg by mouth daily.    Historical Provider, MD  HYDROcodone-acetaminophen (NORCO/VICODIN) 5-325 MG per tablet Take 1 tablet by mouth every 6 (six) hours as needed (pain).    Historical Provider, MD  HYDROcodone-acetaminophen (NORCO/VICODIN) 5-325 MG per tablet Take 2 tablets by mouth every 4 (four) hours as needed. 05/20/14   Rolland Porter, MD  losartan (COZAAR) 100 MG tablet Take 100 mg by mouth daily.  Historical Provider, MD  Multiple Vitamin (MULTIVITAMIN WITH MINERALS) TABS Take 1 tablet by mouth daily.    Historical Provider, MD  nabumetone (RELAFEN) 500 MG tablet Take 1,000 mg by mouth daily.    Historical Provider, MD  naproxen (NAPROSYN) 500 MG tablet Take 1 tablet (500 mg total) by mouth 2 (two) times daily. 05/20/14   Rolland PorterMark James, MD  omega-3 acid ethyl esters (LOVAZA) 1 G capsule Take 1 g by mouth daily.    Historical Provider, MD  omeprazole (PRILOSEC) 20 MG  capsule Take 20 mg by mouth daily.    Historical Provider, MD  raloxifene (EVISTA) 60 MG tablet Take 60 mg by mouth daily.    Historical Provider, MD  tiotropium (SPIRIVA) 18 MCG inhalation capsule Place 18 mcg into inhaler and inhale daily.    Historical Provider, MD  verapamil (VERELAN PM) 180 MG 24 hr capsule Take 360 mg by mouth at bedtime.    Historical Provider, MD   BP 141/65 mmHg  Pulse 54  Temp(Src) 98.9 F (37.2 C) (Oral)  Resp 16  Ht 5\' 2"  (1.575 m)  SpO2 100% Physical Exam  Constitutional: She appears well-developed and well-nourished.  HENT:  Head: Normocephalic and atraumatic.  Eyes: Conjunctivae are normal.  Neck: Normal range of motion. Neck supple.  Cardiovascular: Normal rate, regular rhythm, normal heart sounds and intact distal pulses.   Pulmonary/Chest: Effort normal and breath sounds normal. She has no wheezes. She exhibits no tenderness.  No breast tenderness, no mass, no deformity or nipple dc.  Skin normal without induration, no dimpling.  Abdominal: Soft. Bowel sounds are normal. She exhibits no mass. There is no tenderness.  Musculoskeletal: Normal range of motion.  Lymphadenopathy:    She has no cervical adenopathy.    She has no axillary adenopathy.  Neurological: She is alert.  Skin: Skin is warm and dry.  Psychiatric: She has a normal mood and affect.  Nursing note and vitals reviewed.   ED Course  Procedures (including critical care time) Labs Review Labs Reviewed - No data to display  Imaging Review Dg Chest 2 View  06/09/2015  CLINICAL DATA:  Left-sided chest pain, intermittent, more severe recently EXAM: CHEST  2 VIEW COMPARISON:  None. FINDINGS: The lungs are mildly hyperexpanded. There is scarring in the posterior segment of the right upper lobe. There is also mild scarring in the left mid lung region. There is no edema or consolidation. Heart size and pulmonary vascularity are normal. No adenopathy. There is atherosclerotic calcification  in the aorta. No pneumothorax. No bone lesions. IMPRESSION: Areas of scarring bilaterally. Lungs are hyperexpanded. No edema or consolidation. Electronically Signed   By: Bretta BangWilliam  Woodruff III M.D.   On: 06/09/2015 12:04   I have personally reviewed and evaluated these images and lab results as part of my medical decision-making.   EKG Interpretation None      MDM   Final diagnoses:  Breast pain, left    Chest xray reviewed, mammogram report from last month also reviewed.  Chest wall/breast pain of unclear etiology.  With random, isolated and fleeting episodes, doubt clinically significant source, probable chest wall and/or breast connective tissue/ligament source.  Encouraged to wear supportive bra. F/u with pcp prn if sx persist or worsen or become more frequent or she develops new sx.  The patient appears reasonably screened and/or stabilized for discharge and I doubt any other medical condition or other [facility] requiring further screening, evaluation, or treatment in the ED at this time prior  to discharge.  Pt was seen by Dr. Ranae Palms prior to dc home.    Burgess Amor, PA-C 06/10/15 2319  Loren Racer, MD 06/13/15 1038

## 2015-07-02 DIAGNOSIS — H401133 Primary open-angle glaucoma, bilateral, severe stage: Secondary | ICD-10-CM | POA: Diagnosis not present

## 2015-07-13 DIAGNOSIS — I10 Essential (primary) hypertension: Secondary | ICD-10-CM | POA: Diagnosis not present

## 2015-07-13 DIAGNOSIS — R601 Generalized edema: Secondary | ICD-10-CM | POA: Diagnosis not present

## 2015-07-13 DIAGNOSIS — J449 Chronic obstructive pulmonary disease, unspecified: Secondary | ICD-10-CM | POA: Diagnosis not present

## 2015-07-13 DIAGNOSIS — K210 Gastro-esophageal reflux disease with esophagitis: Secondary | ICD-10-CM | POA: Diagnosis not present

## 2015-07-16 ENCOUNTER — Other Ambulatory Visit (HOSPITAL_COMMUNITY): Payer: Medicare Other

## 2015-07-20 ENCOUNTER — Ambulatory Visit (HOSPITAL_COMMUNITY)
Admission: RE | Admit: 2015-07-20 | Discharge: 2015-07-20 | Disposition: A | Discharge status: Home / Self Care | Payer: Medicare Other | Source: Ambulatory Visit | Attending: Pulmonary Disease | Admitting: Pulmonary Disease

## 2015-07-20 ENCOUNTER — Ambulatory Visit (HOSPITAL_BASED_OUTPATIENT_CLINIC_OR_DEPARTMENT_OTHER)
Admission: RE | Admit: 2015-07-20 | Discharge: 2015-07-20 | Disposition: A | Discharge status: Home / Self Care | Payer: Medicare Other | Source: Ambulatory Visit | Attending: Pulmonary Disease | Admitting: Pulmonary Disease

## 2015-07-20 DIAGNOSIS — I10 Essential (primary) hypertension: Secondary | ICD-10-CM | POA: Insufficient documentation

## 2015-07-20 DIAGNOSIS — R600 Localized edema: Secondary | ICD-10-CM | POA: Diagnosis not present

## 2015-07-20 DIAGNOSIS — R609 Edema, unspecified: Secondary | ICD-10-CM | POA: Insufficient documentation

## 2015-07-20 DIAGNOSIS — I081 Rheumatic disorders of both mitral and tricuspid valves: Secondary | ICD-10-CM | POA: Insufficient documentation

## 2015-07-20 DIAGNOSIS — R079 Chest pain, unspecified: Secondary | ICD-10-CM

## 2015-08-17 DIAGNOSIS — R002 Palpitations: Secondary | ICD-10-CM | POA: Diagnosis not present

## 2015-08-17 DIAGNOSIS — I10 Essential (primary) hypertension: Secondary | ICD-10-CM | POA: Diagnosis not present

## 2015-08-17 DIAGNOSIS — M159 Polyosteoarthritis, unspecified: Secondary | ICD-10-CM | POA: Diagnosis not present

## 2015-08-17 DIAGNOSIS — J449 Chronic obstructive pulmonary disease, unspecified: Secondary | ICD-10-CM | POA: Diagnosis not present

## 2015-11-04 DIAGNOSIS — Z87891 Personal history of nicotine dependence: Secondary | ICD-10-CM | POA: Diagnosis not present

## 2015-11-04 DIAGNOSIS — H401133 Primary open-angle glaucoma, bilateral, severe stage: Secondary | ICD-10-CM | POA: Diagnosis not present

## 2015-11-04 DIAGNOSIS — Z79899 Other long term (current) drug therapy: Secondary | ICD-10-CM | POA: Diagnosis not present

## 2015-11-05 DIAGNOSIS — M159 Polyosteoarthritis, unspecified: Secondary | ICD-10-CM | POA: Diagnosis not present

## 2015-12-15 DIAGNOSIS — M545 Low back pain: Secondary | ICD-10-CM | POA: Diagnosis not present

## 2015-12-15 DIAGNOSIS — J449 Chronic obstructive pulmonary disease, unspecified: Secondary | ICD-10-CM | POA: Diagnosis not present

## 2015-12-15 DIAGNOSIS — K210 Gastro-esophageal reflux disease with esophagitis: Secondary | ICD-10-CM | POA: Diagnosis not present

## 2015-12-15 DIAGNOSIS — I10 Essential (primary) hypertension: Secondary | ICD-10-CM | POA: Diagnosis not present

## 2016-02-04 DIAGNOSIS — Z9889 Other specified postprocedural states: Secondary | ICD-10-CM | POA: Diagnosis not present

## 2016-02-04 DIAGNOSIS — Z87891 Personal history of nicotine dependence: Secondary | ICD-10-CM | POA: Diagnosis not present

## 2016-02-04 DIAGNOSIS — Z961 Presence of intraocular lens: Secondary | ICD-10-CM | POA: Diagnosis not present

## 2016-02-04 DIAGNOSIS — H401133 Primary open-angle glaucoma, bilateral, severe stage: Secondary | ICD-10-CM | POA: Diagnosis not present

## 2016-02-23 ENCOUNTER — Other Ambulatory Visit: Payer: Self-pay

## 2016-03-28 ENCOUNTER — Other Ambulatory Visit (HOSPITAL_COMMUNITY): Payer: Self-pay | Admitting: Pulmonary Disease

## 2016-03-28 DIAGNOSIS — Z1231 Encounter for screening mammogram for malignant neoplasm of breast: Secondary | ICD-10-CM

## 2016-04-15 DIAGNOSIS — I10 Essential (primary) hypertension: Secondary | ICD-10-CM | POA: Diagnosis not present

## 2016-04-15 DIAGNOSIS — J449 Chronic obstructive pulmonary disease, unspecified: Secondary | ICD-10-CM | POA: Diagnosis not present

## 2016-04-15 DIAGNOSIS — M159 Polyosteoarthritis, unspecified: Secondary | ICD-10-CM | POA: Diagnosis not present

## 2016-04-15 DIAGNOSIS — Z23 Encounter for immunization: Secondary | ICD-10-CM | POA: Diagnosis not present

## 2016-04-15 DIAGNOSIS — K210 Gastro-esophageal reflux disease with esophagitis: Secondary | ICD-10-CM | POA: Diagnosis not present

## 2016-05-06 DIAGNOSIS — M159 Polyosteoarthritis, unspecified: Secondary | ICD-10-CM | POA: Diagnosis not present

## 2016-05-09 ENCOUNTER — Ambulatory Visit (HOSPITAL_COMMUNITY)
Admission: RE | Admit: 2016-05-09 | Discharge: 2016-05-09 | Disposition: A | Discharge status: Home / Self Care | Payer: Medicare Other | Source: Ambulatory Visit | Attending: Pulmonary Disease | Admitting: Pulmonary Disease

## 2016-05-09 DIAGNOSIS — Z1231 Encounter for screening mammogram for malignant neoplasm of breast: Secondary | ICD-10-CM | POA: Diagnosis not present

## 2016-05-13 DIAGNOSIS — Z87891 Personal history of nicotine dependence: Secondary | ICD-10-CM | POA: Diagnosis not present

## 2016-05-13 DIAGNOSIS — H401133 Primary open-angle glaucoma, bilateral, severe stage: Secondary | ICD-10-CM | POA: Diagnosis not present

## 2016-07-20 DIAGNOSIS — H401133 Primary open-angle glaucoma, bilateral, severe stage: Secondary | ICD-10-CM | POA: Diagnosis not present

## 2016-07-20 DIAGNOSIS — Z87891 Personal history of nicotine dependence: Secondary | ICD-10-CM | POA: Diagnosis not present

## 2016-08-11 DIAGNOSIS — H401133 Primary open-angle glaucoma, bilateral, severe stage: Secondary | ICD-10-CM | POA: Diagnosis not present

## 2016-08-11 DIAGNOSIS — Z961 Presence of intraocular lens: Secondary | ICD-10-CM | POA: Diagnosis not present

## 2016-08-11 DIAGNOSIS — Z79899 Other long term (current) drug therapy: Secondary | ICD-10-CM | POA: Diagnosis not present

## 2016-08-16 DIAGNOSIS — M159 Polyosteoarthritis, unspecified: Secondary | ICD-10-CM | POA: Diagnosis not present

## 2016-08-16 DIAGNOSIS — I10 Essential (primary) hypertension: Secondary | ICD-10-CM | POA: Diagnosis not present

## 2016-08-16 DIAGNOSIS — J449 Chronic obstructive pulmonary disease, unspecified: Secondary | ICD-10-CM | POA: Diagnosis not present

## 2016-08-16 DIAGNOSIS — H9193 Unspecified hearing loss, bilateral: Secondary | ICD-10-CM | POA: Diagnosis not present

## 2016-11-03 DIAGNOSIS — M159 Polyosteoarthritis, unspecified: Secondary | ICD-10-CM | POA: Diagnosis not present

## 2016-11-24 DIAGNOSIS — H401133 Primary open-angle glaucoma, bilateral, severe stage: Secondary | ICD-10-CM | POA: Diagnosis not present

## 2016-11-24 DIAGNOSIS — Z961 Presence of intraocular lens: Secondary | ICD-10-CM | POA: Diagnosis not present

## 2016-11-24 DIAGNOSIS — Z87891 Personal history of nicotine dependence: Secondary | ICD-10-CM | POA: Diagnosis not present

## 2016-11-24 DIAGNOSIS — H47293 Other optic atrophy, bilateral: Secondary | ICD-10-CM | POA: Diagnosis not present

## 2016-12-14 DIAGNOSIS — M25551 Pain in right hip: Secondary | ICD-10-CM | POA: Diagnosis not present

## 2016-12-14 DIAGNOSIS — I10 Essential (primary) hypertension: Secondary | ICD-10-CM | POA: Diagnosis not present

## 2016-12-14 DIAGNOSIS — J449 Chronic obstructive pulmonary disease, unspecified: Secondary | ICD-10-CM | POA: Diagnosis not present

## 2016-12-14 DIAGNOSIS — M159 Polyosteoarthritis, unspecified: Secondary | ICD-10-CM | POA: Diagnosis not present

## 2016-12-26 ENCOUNTER — Other Ambulatory Visit (HOSPITAL_COMMUNITY): Payer: Self-pay | Admitting: Pulmonary Disease

## 2016-12-26 ENCOUNTER — Ambulatory Visit (HOSPITAL_COMMUNITY)
Admission: RE | Admit: 2016-12-26 | Discharge: 2016-12-26 | Disposition: A | Discharge status: Home / Self Care | Payer: Medicare Other | Source: Ambulatory Visit | Attending: Pulmonary Disease | Admitting: Pulmonary Disease

## 2016-12-26 DIAGNOSIS — M25551 Pain in right hip: Secondary | ICD-10-CM

## 2016-12-26 DIAGNOSIS — J449 Chronic obstructive pulmonary disease, unspecified: Secondary | ICD-10-CM | POA: Diagnosis not present

## 2016-12-26 DIAGNOSIS — M159 Polyosteoarthritis, unspecified: Secondary | ICD-10-CM | POA: Diagnosis not present

## 2016-12-26 DIAGNOSIS — R5383 Other fatigue: Secondary | ICD-10-CM | POA: Diagnosis not present

## 2016-12-26 DIAGNOSIS — I10 Essential (primary) hypertension: Secondary | ICD-10-CM | POA: Diagnosis not present

## 2017-01-23 DIAGNOSIS — E875 Hyperkalemia: Secondary | ICD-10-CM | POA: Diagnosis not present

## 2017-02-22 DIAGNOSIS — H401133 Primary open-angle glaucoma, bilateral, severe stage: Secondary | ICD-10-CM | POA: Diagnosis not present

## 2017-04-03 ENCOUNTER — Other Ambulatory Visit (HOSPITAL_COMMUNITY): Payer: Self-pay | Admitting: Pulmonary Disease

## 2017-04-03 DIAGNOSIS — Z1231 Encounter for screening mammogram for malignant neoplasm of breast: Secondary | ICD-10-CM

## 2017-04-17 DIAGNOSIS — J449 Chronic obstructive pulmonary disease, unspecified: Secondary | ICD-10-CM | POA: Diagnosis not present

## 2017-04-17 DIAGNOSIS — M159 Polyosteoarthritis, unspecified: Secondary | ICD-10-CM | POA: Diagnosis not present

## 2017-04-17 DIAGNOSIS — Z23 Encounter for immunization: Secondary | ICD-10-CM | POA: Diagnosis not present

## 2017-04-17 DIAGNOSIS — I10 Essential (primary) hypertension: Secondary | ICD-10-CM | POA: Diagnosis not present

## 2017-04-17 DIAGNOSIS — R5383 Other fatigue: Secondary | ICD-10-CM | POA: Diagnosis not present

## 2017-05-09 ENCOUNTER — Encounter (HOSPITAL_COMMUNITY): Payer: Self-pay | Admitting: Emergency Medicine

## 2017-05-09 ENCOUNTER — Emergency Department (HOSPITAL_COMMUNITY): Payer: Medicare Other

## 2017-05-09 ENCOUNTER — Inpatient Hospital Stay (HOSPITAL_COMMUNITY): Payer: Medicare Other

## 2017-05-09 ENCOUNTER — Inpatient Hospital Stay (HOSPITAL_COMMUNITY)
Admission: EM | Admit: 2017-05-09 | Discharge: 2017-05-12 | DRG: 064 | Disposition: A | Discharge status: Skilled Nursing Facility | Payer: Medicare Other | Attending: Pulmonary Disease | Admitting: Pulmonary Disease

## 2017-05-09 ENCOUNTER — Other Ambulatory Visit: Payer: Self-pay

## 2017-05-09 DIAGNOSIS — Z791 Long term (current) use of non-steroidal anti-inflammatories (NSAID): Secondary | ICD-10-CM | POA: Diagnosis not present

## 2017-05-09 DIAGNOSIS — H53462 Homonymous bilateral field defects, left side: Secondary | ICD-10-CM | POA: Diagnosis present

## 2017-05-09 DIAGNOSIS — M1990 Unspecified osteoarthritis, unspecified site: Secondary | ICD-10-CM | POA: Diagnosis not present

## 2017-05-09 DIAGNOSIS — Y92009 Unspecified place in unspecified non-institutional (private) residence as the place of occurrence of the external cause: Secondary | ICD-10-CM

## 2017-05-09 DIAGNOSIS — H4050X0 Glaucoma secondary to other eye disorders, unspecified eye, stage unspecified: Secondary | ICD-10-CM

## 2017-05-09 DIAGNOSIS — R059 Cough, unspecified: Secondary | ICD-10-CM

## 2017-05-09 DIAGNOSIS — H40033 Anatomical narrow angle, bilateral: Secondary | ICD-10-CM | POA: Diagnosis not present

## 2017-05-09 DIAGNOSIS — W19XXXA Unspecified fall, initial encounter: Secondary | ICD-10-CM | POA: Diagnosis present

## 2017-05-09 DIAGNOSIS — Z79891 Long term (current) use of opiate analgesic: Secondary | ICD-10-CM | POA: Diagnosis not present

## 2017-05-09 DIAGNOSIS — F4489 Other dissociative and conversion disorders: Secondary | ICD-10-CM | POA: Diagnosis not present

## 2017-05-09 DIAGNOSIS — J449 Chronic obstructive pulmonary disease, unspecified: Secondary | ICD-10-CM | POA: Diagnosis not present

## 2017-05-09 DIAGNOSIS — I2699 Other pulmonary embolism without acute cor pulmonale: Secondary | ICD-10-CM | POA: Diagnosis not present

## 2017-05-09 DIAGNOSIS — I340 Nonrheumatic mitral (valve) insufficiency: Secondary | ICD-10-CM | POA: Diagnosis not present

## 2017-05-09 DIAGNOSIS — R2681 Unsteadiness on feet: Secondary | ICD-10-CM | POA: Diagnosis not present

## 2017-05-09 DIAGNOSIS — R296 Repeated falls: Secondary | ICD-10-CM | POA: Diagnosis present

## 2017-05-09 DIAGNOSIS — Z87891 Personal history of nicotine dependence: Secondary | ICD-10-CM | POA: Diagnosis not present

## 2017-05-09 DIAGNOSIS — H409 Unspecified glaucoma: Secondary | ICD-10-CM | POA: Diagnosis present

## 2017-05-09 DIAGNOSIS — I639 Cerebral infarction, unspecified: Secondary | ICD-10-CM | POA: Diagnosis not present

## 2017-05-09 DIAGNOSIS — R05 Cough: Secondary | ICD-10-CM | POA: Diagnosis not present

## 2017-05-09 DIAGNOSIS — R531 Weakness: Secondary | ICD-10-CM | POA: Diagnosis not present

## 2017-05-09 DIAGNOSIS — Z79899 Other long term (current) drug therapy: Secondary | ICD-10-CM

## 2017-05-09 DIAGNOSIS — I6930 Unspecified sequelae of cerebral infarction: Secondary | ICD-10-CM | POA: Diagnosis not present

## 2017-05-09 DIAGNOSIS — Z9842 Cataract extraction status, left eye: Secondary | ICD-10-CM

## 2017-05-09 DIAGNOSIS — I69952 Hemiplegia and hemiparesis following unspecified cerebrovascular disease affecting left dominant side: Secondary | ICD-10-CM | POA: Diagnosis not present

## 2017-05-09 DIAGNOSIS — R4182 Altered mental status, unspecified: Secondary | ICD-10-CM | POA: Diagnosis not present

## 2017-05-09 DIAGNOSIS — R29714 NIHSS score 14: Secondary | ICD-10-CM | POA: Diagnosis present

## 2017-05-09 DIAGNOSIS — R29810 Facial weakness: Secondary | ICD-10-CM | POA: Diagnosis not present

## 2017-05-09 DIAGNOSIS — Z9841 Cataract extraction status, right eye: Secondary | ICD-10-CM

## 2017-05-09 DIAGNOSIS — I10 Essential (primary) hypertension: Secondary | ICD-10-CM | POA: Diagnosis not present

## 2017-05-09 DIAGNOSIS — G9341 Metabolic encephalopathy: Secondary | ICD-10-CM | POA: Diagnosis present

## 2017-05-09 DIAGNOSIS — M810 Age-related osteoporosis without current pathological fracture: Secondary | ICD-10-CM | POA: Diagnosis present

## 2017-05-09 DIAGNOSIS — I6523 Occlusion and stenosis of bilateral carotid arteries: Secondary | ICD-10-CM | POA: Diagnosis not present

## 2017-05-09 DIAGNOSIS — H9190 Unspecified hearing loss, unspecified ear: Secondary | ICD-10-CM | POA: Diagnosis not present

## 2017-05-09 DIAGNOSIS — M6281 Muscle weakness (generalized): Secondary | ICD-10-CM | POA: Diagnosis not present

## 2017-05-09 DIAGNOSIS — R29818 Other symptoms and signs involving the nervous system: Secondary | ICD-10-CM | POA: Diagnosis not present

## 2017-05-09 DIAGNOSIS — E785 Hyperlipidemia, unspecified: Secondary | ICD-10-CM | POA: Diagnosis present

## 2017-05-09 DIAGNOSIS — I6789 Other cerebrovascular disease: Secondary | ICD-10-CM | POA: Diagnosis not present

## 2017-05-09 DIAGNOSIS — G8194 Hemiplegia, unspecified affecting left nondominant side: Secondary | ICD-10-CM | POA: Diagnosis not present

## 2017-05-09 DIAGNOSIS — G459 Transient cerebral ischemic attack, unspecified: Secondary | ICD-10-CM | POA: Diagnosis not present

## 2017-05-09 DIAGNOSIS — I63511 Cerebral infarction due to unspecified occlusion or stenosis of right middle cerebral artery: Secondary | ICD-10-CM | POA: Diagnosis not present

## 2017-05-09 LAB — I-STAT CHEM 8, ED
BUN: 11 mg/dL (ref 6–20)
CHLORIDE: 115 mmol/L — AB (ref 101–111)
Calcium, Ion: 1.13 mmol/L — ABNORMAL LOW (ref 1.15–1.40)
Creatinine, Ser: 1 mg/dL (ref 0.44–1.00)
GLUCOSE: 98 mg/dL (ref 65–99)
HEMATOCRIT: 36 % (ref 36.0–46.0)
HEMOGLOBIN: 12.2 g/dL (ref 12.0–15.0)
POTASSIUM: 4.2 mmol/L (ref 3.5–5.1)
Sodium: 143 mmol/L (ref 135–145)
TCO2: 19 mmol/L — ABNORMAL LOW (ref 22–32)

## 2017-05-09 LAB — COMPREHENSIVE METABOLIC PANEL
ALBUMIN: 3.5 g/dL (ref 3.5–5.0)
ALT: 9 U/L — ABNORMAL LOW (ref 14–54)
ANION GAP: 6 (ref 5–15)
AST: 24 U/L (ref 15–41)
Alkaline Phosphatase: 31 U/L — ABNORMAL LOW (ref 38–126)
BILIRUBIN TOTAL: 0.5 mg/dL (ref 0.3–1.2)
BUN: 12 mg/dL (ref 6–20)
CO2: 22 mmol/L (ref 22–32)
Calcium: 9 mg/dL (ref 8.9–10.3)
Chloride: 113 mmol/L — ABNORMAL HIGH (ref 101–111)
Creatinine, Ser: 1.09 mg/dL — ABNORMAL HIGH (ref 0.44–1.00)
GFR calc non Af Amer: 47 mL/min — ABNORMAL LOW (ref >60)
GFR, EST AFRICAN AMERICAN: 54 mL/min — AB (ref >60)
GLUCOSE: 101 mg/dL — AB (ref 65–99)
POTASSIUM: 4 mmol/L (ref 3.5–5.1)
SODIUM: 141 mmol/L (ref 135–145)
TOTAL PROTEIN: 7.5 g/dL (ref 6.5–8.1)

## 2017-05-09 LAB — CBC WITH DIFFERENTIAL/PLATELET
Basophils Absolute: 0 10*3/uL (ref 0.0–0.1)
Basophils Relative: 0 %
Eosinophils Absolute: 0.3 10*3/uL (ref 0.0–0.7)
Eosinophils Relative: 3 %
LYMPHS ABS: 2.2 10*3/uL (ref 0.7–4.0)
LYMPHS PCT: 27 %
Monocytes Absolute: 0.7 10*3/uL (ref 0.1–1.0)
Monocytes Relative: 9 %
NEUTROS ABS: 5 10*3/uL (ref 1.7–7.7)
NEUTROS PCT: 61 %

## 2017-05-09 LAB — RAPID URINE DRUG SCREEN, HOSP PERFORMED
Amphetamines: NOT DETECTED
BARBITURATES: NOT DETECTED
Benzodiazepines: NOT DETECTED
Cocaine: NOT DETECTED
Opiates: NOT DETECTED
TETRAHYDROCANNABINOL: NOT DETECTED

## 2017-05-09 LAB — URINALYSIS, ROUTINE W REFLEX MICROSCOPIC
BILIRUBIN URINE: NEGATIVE
Glucose, UA: NEGATIVE mg/dL
HGB URINE DIPSTICK: NEGATIVE
KETONES UR: NEGATIVE mg/dL
Leukocytes, UA: NEGATIVE
NITRITE: NEGATIVE
PH: 6 (ref 5.0–8.0)
Protein, ur: NEGATIVE mg/dL
Specific Gravity, Urine: 1.004 — ABNORMAL LOW (ref 1.005–1.030)

## 2017-05-09 LAB — PROTIME-INR
INR: 0.99
PROTHROMBIN TIME: 13 s (ref 11.4–15.2)

## 2017-05-09 LAB — CBC
HCT: 36.8 % (ref 36.0–46.0)
Hemoglobin: 11.4 g/dL — ABNORMAL LOW (ref 12.0–15.0)
MCH: 26.4 pg (ref 26.0–34.0)
MCHC: 31 g/dL (ref 30.0–36.0)
MCV: 85.2 fL (ref 78.0–100.0)
PLATELETS: 203 10*3/uL (ref 150–400)
RBC: 4.32 MIL/uL (ref 3.87–5.11)
RDW: 15.2 % (ref 11.5–15.5)
WBC: 8.3 10*3/uL (ref 4.0–10.5)

## 2017-05-09 LAB — I-STAT TROPONIN, ED: TROPONIN I, POC: 0.01 ng/mL (ref 0.00–0.08)

## 2017-05-09 LAB — APTT: APTT: 24 s (ref 24–36)

## 2017-05-09 LAB — ETHANOL

## 2017-05-09 MED ORDER — OMEGA-3-ACID ETHYL ESTERS 1 G PO CAPS
1.0000 g | ORAL_CAPSULE | Freq: Every day | ORAL | Status: DC
  Administered 2017-05-10 – 2017-05-12 (×3): 1 g via ORAL
  Filled 2017-05-09 (×4): qty 1

## 2017-05-09 MED ORDER — LATANOPROST 0.005 % OP SOLN
1.0000 [drp] | Freq: Every day | OPHTHALMIC | Status: DC
  Administered 2017-05-09 – 2017-05-11 (×3): 1 [drp] via OPHTHALMIC
  Filled 2017-05-09: qty 2.5

## 2017-05-09 MED ORDER — TIOTROPIUM BROMIDE MONOHYDRATE 18 MCG IN CAPS
18.0000 ug | ORAL_CAPSULE | Freq: Every day | RESPIRATORY_TRACT | Status: DC
  Administered 2017-05-11 – 2017-05-12 (×2): 18 ug via RESPIRATORY_TRACT
  Filled 2017-05-09: qty 5

## 2017-05-09 MED ORDER — ONDANSETRON HCL 4 MG/2ML IJ SOLN: 4.0000 mg | Freq: Four times a day (QID) | INTRAMUSCULAR | Status: DC | PRN

## 2017-05-09 MED ORDER — BRIMONIDINE TARTRATE 0.2 % OP SOLN
1.0000 [drp] | Freq: Two times a day (BID) | OPHTHALMIC | Status: DC
  Administered 2017-05-09 – 2017-05-12 (×6): 1 [drp] via OPHTHALMIC
  Filled 2017-05-09 (×2): qty 5

## 2017-05-09 MED ORDER — ATORVASTATIN CALCIUM 40 MG PO TABS
80.0000 mg | ORAL_TABLET | Freq: Every day | ORAL | Status: DC
  Administered 2017-05-10 – 2017-05-11 (×2): 80 mg via ORAL
  Filled 2017-05-09 (×3): qty 2

## 2017-05-09 MED ORDER — ASPIRIN 300 MG RE SUPP: 300.0000 mg | Freq: Every day | RECTAL | Status: DC

## 2017-05-09 MED ORDER — SODIUM CHLORIDE 0.9 % IV SOLN
INTRAVENOUS | Status: DC
  Administered 2017-05-09 – 2017-05-11 (×3): via INTRAVENOUS

## 2017-05-09 MED ORDER — BISACODYL 5 MG PO TBEC: 5.0000 mg | DELAYED_RELEASE_TABLET | Freq: Every day | ORAL | Status: DC | PRN

## 2017-05-09 MED ORDER — PANTOPRAZOLE SODIUM 40 MG PO TBEC
40.0000 mg | DELAYED_RELEASE_TABLET | Freq: Every day | ORAL | Status: DC
Start: 2017-05-09 — End: 2017-05-12
  Administered 2017-05-10 – 2017-05-11 (×2): 40 mg via ORAL
  Filled 2017-05-09 (×4): qty 1

## 2017-05-09 MED ORDER — RALOXIFENE HCL 60 MG PO TABS
60.0000 mg | ORAL_TABLET | Freq: Every day | ORAL | Status: DC
  Administered 2017-05-10 – 2017-05-12 (×3): 60 mg via ORAL
  Filled 2017-05-09 (×4): qty 1

## 2017-05-09 MED ORDER — SODIUM CHLORIDE 0.9 % IV BOLUS (SEPSIS)
1000.0000 mL | Freq: Once | INTRAVENOUS | Status: AC
  Administered 2017-05-09: 1000 mL via INTRAVENOUS

## 2017-05-09 MED ORDER — ADULT MULTIVITAMIN W/MINERALS CH
1.0000 | ORAL_TABLET | Freq: Every day | ORAL | Status: DC
  Administered 2017-05-10 – 2017-05-12 (×3): 1 via ORAL
  Filled 2017-05-09 (×4): qty 1

## 2017-05-09 MED ORDER — ONDANSETRON HCL 4 MG PO TABS: 4.0000 mg | ORAL_TABLET | Freq: Four times a day (QID) | ORAL | Status: DC | PRN

## 2017-05-09 MED ORDER — DORZOLAMIDE HCL-TIMOLOL MAL 2-0.5 % OP SOLN
1.0000 [drp] | Freq: Two times a day (BID) | OPHTHALMIC | Status: DC
  Administered 2017-05-09 – 2017-05-12 (×6): 1 [drp] via OPHTHALMIC
  Filled 2017-05-09: qty 10

## 2017-05-09 MED ORDER — ENOXAPARIN SODIUM 40 MG/0.4ML ~~LOC~~ SOLN
40.0000 mg | SUBCUTANEOUS | Status: DC
  Administered 2017-05-09 – 2017-05-11 (×3): 40 mg via SUBCUTANEOUS
  Filled 2017-05-09 (×3): qty 0.4

## 2017-05-09 MED ORDER — CLOPIDOGREL BISULFATE 75 MG PO TABS
75.0000 mg | ORAL_TABLET | Freq: Every day | ORAL | Status: DC
  Administered 2017-05-10 – 2017-05-12 (×3): 75 mg via ORAL
  Filled 2017-05-09 (×3): qty 1

## 2017-05-09 MED ORDER — STROKE: EARLY STAGES OF RECOVERY BOOK
Freq: Once | Status: AC
  Administered 2017-05-09: 18:00:00
  Filled 2017-05-09: qty 1

## 2017-05-09 MED ORDER — ASPIRIN 325 MG PO TABS
325.0000 mg | ORAL_TABLET | Freq: Every day | ORAL | Status: DC
  Administered 2017-05-10 – 2017-05-12 (×3): 325 mg via ORAL
  Filled 2017-05-09 (×3): qty 1

## 2017-05-09 MED ORDER — VERAPAMIL HCL ER 180 MG PO TBCR
360.0000 mg | EXTENDED_RELEASE_TABLET | Freq: Every day | ORAL | Status: DC
  Administered 2017-05-09 – 2017-05-11 (×3): 360 mg via ORAL
  Filled 2017-05-09 (×3): qty 2

## 2017-05-09 MED ORDER — ASPIRIN 300 MG RE SUPP
300.0000 mg | Freq: Once | RECTAL | Status: AC
  Administered 2017-05-09: 300 mg via RECTAL
  Filled 2017-05-09: qty 1

## 2017-05-09 NOTE — H&P (Signed)
TRH H&P   Patient Demographics:    [name], is a 82 y.o. female  MRN: [id number]   DOB - 02/27/1935  Admit Date - 05/09/2017  Outpatient Primary MD for the patient is Kari BaarsHawkins, Edward, MD  Referring MD: Dr. Clayborne DanaMesner  Outpatient Specialists: None  Patient coming from: Home  Chief Complaint  Patient presents with  . Weakness      HPI:    Sheena ReusingLillie Savarino  is a 82 y.o. female, history of arthritis, hypertension and COPD was brought to the hospital after she was found on the floor.  She was last known to be normal about 16 hours back ?last evening when 1 of her neice spoke with her on the phone.  She did not answer her door this morning and her nephew had to break down the door to get in.  EMS found patient to have abnormal gait, left-sided weakness and left-sided facial droop.  Patient was alert during my conversation but had impaired vision on her left eye and left lateral gaze.  She informs that she was having frequent falls at home the past week about 3-4 falls yesterday itself.  Patient lives alone and is fairly independent and has not had similar symptoms in the past. History is limited due to patient's participation during conversation and she does appear somewhat confused.  She denies any headache, dizziness, nausea, vomiting, chest pain, palpitations, shortness of breath, abdominal pain, dysuria, diarrhea, weakness in her arms or legs, pain in her extremities.  She has glaucoma with impaired vision and is unable to tell me if her vision is worsened.  On asking why she is in the hospital she can only tell that she was brought in because she fell at home.  Patient is unable to tell me if she has increased weakness in her extremities.  No known illness or sick contact.  In the ED vitals were stable.  Stat head CT for suspected stroke was done which showed acute right MCA infarct  involving right frontoparietal cortex and possibly right lentiform nucleus without any hemorrhage.  Also shows progressive chronic small vessel ischemia bilaterally.  No TPA was given as onset of symptoms unknown. Blood work showed normal WBC, hemoglobin 11.4, normal platelets.  Urine drug screen and troponin were negative.  Chem-8 was normal. Hospitalist consulted for admission to telemetry for acute stroke.     Review of systems:    In addition to the HPI above, ROS limited due to patient's participation in confusion. No Fever-chills, No Headache, No changes with Vision or hearing, No problems swallowing food or Liquids, No Chest pain, Cough or Shortness of Breath, No Abdominal pain, No Nausea or vomiting, Bowel movements are regular, No Blood in stool or Urine, No dysuria, No new skin rashes or bruises, No new joints pains-aches,  No new weakness, tingling, numbness in any extremity, No recent weight  gain or loss, No polyuria, polydypsia or polyphagia, No significant Mental Stressors.  A full 10 point Review of Systems was done, except as stated above, all other Review of Systems were negative.   With Past History of the following :    Past Medical History:  Diagnosis Date  . Arthritis   . COPD (chronic obstructive pulmonary disease) (HCC)   . Hypertension       Past Surgical History:  Procedure Laterality Date  . EYE SURGERY        Social History:     Social History   Tobacco Use  . Smoking status: Former Games developer  . Smokeless tobacco: Never Used  Substance Use Topics  . Alcohol use: Yes    Comment: occ     Lives -alone at home  Mobility -independent     Family History :   No known family history of stroke, heart disease   Home Medications:   Prior to Admission medications   Medication Sig Start Date End Date Taking? Authorizing Provider  bimatoprost (LUMIGAN) 0.01 % SOLN Place 1 drop into the left eye at bedtime.   Yes [provider]    brimonidine (ALPHAGAN) 0.2 % ophthalmic solution Place 1 drop into both eyes 2 (two) times daily.   Yes [provider]  Calcium Carbonate-Vitamin D (CALCIUM 500 + D) 500-125 MG-UNIT TABS Take 1 tablet by mouth daily.   Yes [provider]  denosumab (PROLIA) 60 MG/ML SOLN injection Inject 60 mg into the skin every 6 (six) months. Administer in upper arm, thigh, or abdomen   Yes [provider]  dorzolamide-timolol (COSOPT) 22.3-6.8 MG/ML ophthalmic solution Place 1 drop into both eyes 2 (two) times daily.   Yes [provider]  furosemide (LASIX) 40 MG tablet Take 40 mg by mouth daily.   Yes [provider]  HYDROcodone-acetaminophen (NORCO/VICODIN) 5-325 MG per tablet Take 1 tablet by mouth every 6 (six) hours as needed (pain).   Yes [provider]  losartan (COZAAR) 100 MG tablet Take 100 mg by mouth daily.   Yes [provider]  Multiple Vitamin (MULTIVITAMIN WITH MINERALS) TABS Take 1 tablet by mouth daily.   Yes [provider]  nabumetone (RELAFEN) 500 MG tablet Take 1,000 mg by mouth daily.   Yes [provider]  omega-3 acid ethyl esters (LOVAZA) 1 G capsule Take 1 g by mouth daily.   Yes [provider]  omeprazole (PRILOSEC) 20 MG capsule Take 20 mg by mouth daily.   Yes [provider]  raloxifene (EVISTA) 60 MG tablet Take 60 mg by mouth daily.   Yes [provider]  tiotropium (SPIRIVA) 18 MCG inhalation capsule Place 18 mcg into inhaler and inhale daily.   Yes [provider]  verapamil (VERELAN PM) 180 MG 24 hr capsule Take 360 mg by mouth at bedtime.   Yes [provider]  HYDROcodone-acetaminophen (NORCO/VICODIN) 5-325 MG per tablet Take 2 tablets by mouth every 4 (four) hours as needed. 05/20/14   Rolland Porter, MD  naproxen (NAPROSYN) 500 MG tablet Take 1 tablet (500 mg total) by mouth 2 (two) times daily. 05/20/14   Rolland Porter, MD     Allergies:    No  Known Allergies   Physical Exam:   Vitals  Blood pressure (!) 142/51, pulse (!) 59, temperature 98.2 F (36.8 C), resp. rate 18, height 5\' 4"  (1.626 m), weight 70.3 kg (155 lb), SpO2 98 %.   General elderly female lying  in bed in no acute distress HEENT: Pupils reactive on right, limited on left possibly due to her glaucoma, impaired left lateral gaze  EOMI, no pallor, no icterus, moist oral mucosa, supple neck no cervical lymphadenopathy Chest: Clear to auscultation bilaterally, no added sounds CVS: Normal S1 and S2, no murmurs or gallop GI: Soft, nondistended, nontender, bowel sounds present Musculoskeletal: Warm, no edema, normal skin CNS: Alert and oriented x2-3, impaired left lateral gaze with impaired left lateral field of vision, reduced left hand grip (4+/5) with impaired sensation in the left hand and forearm.  Normal strength in lower extremities, normal tone and reflexes in all extremities.     Data Review:    CBC Recent Labs  Lab 05/09/17 1159 05/09/17 1219  WBC DUPLICATE CBC ALREADY DONE  --   HGB DUPLICATE CBC ALREADY DONE 12.2  HCT DUPLICATE CBC ALREADY DONE 36.0  PLT DUPLICATE CBC ALREADY DONE  --   MCV DUPLICATE CBC ALREADY DONE  --   MCH DUPLICATE CBC ALREADY DONE  --   MCHC DUPLICATE CBC ALREADY DONE  --   RDW DUPLICATE CBC ALREADY DONE  --   LYMPHSABS 2.2  --   MONOABS 0.7  --   EOSABS 0.3  --   BASOSABS 0.0  --    ------------------------------------------------------------------------------------------------------------------  Chemistries  Recent Labs  Lab 05/09/17 1159 05/09/17 1219  NA 141 143  K 4.0 4.2  CL 113* 115*  CO2 22  --   GLUCOSE 101* 98  BUN 12 11  CREATININE 1.09* 1.00  CALCIUM 9.0  --   AST 24  --   ALT 9*  --   ALKPHOS 31*  --   BILITOT 0.5  --    ------------------------------------------------------------------------------------------------------------------ estimated creatinine clearance is 43.9 mL/min (by C-G  formula based on SCr of 1 mg/dL). ------------------------------------------------------------------------------------------------------------------ No results for input(s): TSH, T4TOTAL, T3FREE, THYROIDAB in the last 72 hours.  Invalid input(s): FREET3  Coagulation profile Recent Labs  Lab 05/09/17 1159  INR 0.99   ------------------------------------------------------------------------------------------------------------------- No results for input(s): DDIMER in the last 72 hours. -------------------------------------------------------------------------------------------------------------------  Cardiac Enzymes No results for input(s): CKMB, TROPONINI, MYOGLOBIN in the last 168 hours.  Invalid input(s): CK ------------------------------------------------------------------------------------------------------------------ No results found for: BNP   ---------------------------------------------------------------------------------------------------------------  Urinalysis    Component Value Date/Time   COLORURINE STRAW (A) 05/09/2017 1159   APPEARANCEUR CLEAR 05/09/2017 1159   LABSPEC 1.004 (L) 05/09/2017 1159   PHURINE 6.0 05/09/2017 1159   GLUCOSEU NEGATIVE 05/09/2017 1159   HGBUR NEGATIVE 05/09/2017 1159   BILIRUBINUR NEGATIVE 05/09/2017 1159   KETONESUR NEGATIVE 05/09/2017 1159   PROTEINUR NEGATIVE 05/09/2017 1159   NITRITE NEGATIVE 05/09/2017 1159   LEUKOCYTESUR NEGATIVE 05/09/2017 1159    ----------------------------------------------------------------------------------------------------------------   Imaging Results:    Dg Chest Port 1 View  Result Date: 05/09/2017 CLINICAL DATA:  Cough. EXAM: PORTABLE CHEST 1 VIEW COMPARISON:  Radiographs of June 17, 2015. FINDINGS: Mild cardiomegaly is noted with mild central pulmonary vascular congestion. Atherosclerosis of thoracic aorta is noted. No pneumothorax or pleural effusion is noted. Bony thorax is unremarkable.  No consolidative process is noted. IMPRESSION: Mild cardiomegaly with mild central pulmonary vascular congestion. Aortic atherosclerosis. Electronically Signed   By: Lupita RaiderJames  Green Jr, M.D.   On: 05/09/2017 13:04   Ct Head Code Stroke Wo Contrast  Result Date: 05/09/2017 CLINICAL DATA:  Code stroke. Left-sided weakness. Left facial droop. EXAM: CT HEAD WITHOUT CONTRAST TECHNIQUE: Contiguous axial images were obtained from the base of the skull through the vertex without  intravenous contrast. COMPARISON:  02/13/2008 FINDINGS: Brain: There is a small area of decreased gray-white differentiation in the supraganglionic right frontoparietal region which may reflect an acute infarct. There is increased patchy low attenuation in the right lentiform nucleus compared to the prior CT. There is chronic hypoattenuation in the right internal capsule. A chronic lacunar infarct is again seen in the left lentiform nucleus, although there may be an additional new age indeterminate left lentiform nucleus lacunar infarct, and there also appear to be new age indeterminate lacunar infarcts in both thalami. Patchy to confluent hypodensities throughout the cerebral white matter bilaterally have mildly progressed and are compatible with moderately age advanced chronic small vessel ischemia. No acute intracranial hemorrhage, mass, midline shift, or extra-axial fluid collection is identified. Vascular: Calcified atherosclerosis at the skullbase. Asymmetric increased density of the right MCA at the M2 level. Skull: No fracture focal osseous lesion. Sinuses/Orbits: Visualized paranasal sinuses and mastoid air cells are clear. Bilateral cataract extraction. Other: None. ASPECTS [facility] Stroke Program Early CT Score) - Ganglionic level infarction (caudate, lentiform nuclei, internal capsule, insula, M1-M3 cortex): 6-7 (age indeterminate right lentiform nucleus infarct) - Supraganglionic infarction (M4-M6 cortex): 2 Total score (0-10 with 10  being normal): 8-9 IMPRESSION: 1. Suspected acute right MCA infarct involving right frontoparietal cortex and possibly right lentiform nucleus. No hemorrhage. 2. ASPECTS is 8-9. 3. Hyperdense right MCA at the M2 level which may reflect acute embolus. 4. Progressive chronic small vessel ischemia including new age indeterminate lacunar infarcts in the deep gray nuclei bilaterally. These results were called by telephone at the time of interpretation on 05/09/2017 at 12:51 pm to Dr. Marily Memos , who verbally acknowledged these results. Electronically Signed   By: Sebastian Ache M.D.   On: 05/09/2017 12:54    My personal review of EKG: Sinus rhythm at 66, no ST-T changes   Assessment & Plan:    Principal Problem:   Acute embolic stroke (HCC) Admit to telemetry.  Neurochecks every 4 hours.  Fall and seizure precaution.  Keep n.p.o. until cleared for swallowing.  PT/OT and SLP evaluation. MRI brain, MRA head ordered.  Check 2D echo with bubble study , check carotid Dopplers. Aspirin suppository (switched to full dose oral aspirin once cleared for swallowing).  Add Lipitor 80 mg daily. Check A1c and lipid panel in the morning. Neurology consulted and will follow up with recommendations.  Essential hypertension Resume verapamil.  Hold ARB and Lasix to allow permissive hypertension.  Glaucoma Resume home eyedrops.  History of COPD Continue home inhalers.    DVT Prophylaxis: Lovenox  AM Labs Ordered, also please review Full Orders  Family Communication: Admission, patients condition and plan of care including tests being ordered have been discussed with the patient and her nieces is at bedside  Code Status full code  Likely DC to home versus SNF  Condition GUARDED   Consults called: Neurology  Admission status: Inpatient  Time spent in minutes : 60   Eddie North M.D on 05/09/2017 at 2:12 PM  Between 7am to 7pm - Pager - [phone]. After 7pm go to [email or web address] - password  [facility]  [facility] - Office  [phone]

## 2017-05-09 NOTE — ED Notes (Addendum)
Family members leaving-phone numbers given if needed.  [name] [phone] Osie BondJerlean Johnson [phone]

## 2017-05-09 NOTE — Consult Note (Signed)
Sheena A. Merlene Laughter, MD     [email or web address]          [name] is an 82 y.o. female.   ASSESSMENT/PLAN: 1. Large vessel stroke involving the MCA on the right side due to intracranial occlusive disease: Risk factors age and hypertension. Dual antiplatelet agent with aspirin and Plavix is recommended for 3 months. Statin is also recommended. Follow-up echo and carotid duplex Doppler. Physical and occupational therapies are recommended. The patient will likely need skilled facility for occupational and physical therapy.    Patient is a 82 year old right-handed black female who tells me that she lives by herself and is typically functional. She has a dog that lives with her. She has nieces that checks on her. She reports that she has had some subacute gait impairment which she thinks has been due to osteoarthritis. However, she fell a few times on yesterday and could not get up off the floor. She was found by her niece. She was last known to be normal about 16 hours before presenting to the hospital. The knee decided take the patient to the hospital for further evaluation because of the patient's falling. The patient does not report other symptoms. She denies dizziness, numbness, tingling, headaches, dysarthria or dysphagia although she tells me that her niece thought she may have had some difficulty speaking. There is no dyspnea or, chest pain or other symptoms. The review systems otherwise negative. She's had some visual problems from glaucoma.   GENERAL: This a very pleasant female who is in no acute distress.  HEENT: She has significant hearing impairment which limits the evaluation.  ABDOMEN: soft  EXTREMITIES: No edema   BACK: Normal  SKIN: Normal by inspection.    MENTAL STATUS: Alert and oriented - for the most part including age and month and a hospital. Speech, language and cognition are generally intact. Judgment and insight somewhat limited to  current medical problems/condition. She has significant neglect on the left.  CRANIAL NERVES: Pupils are 4 mm on the right and 3 on the left where it is irregularly shaped; both are reactive; extra ocular movements shows slight limitation on movement to the left going only about 80% but otherwise full, there is no significant nystagmus; visual fields shows a dense left homonymous hemianopia; upper and lower facial muscles are normal in strength and symmetric, there is no flattening of the nasolabial folds; tongue is midline; uvula is midline; shoulder elevation is normal.  MOTOR: She has a classic pronator drift of the left upper extremity. The exact strength is limited due to lack of cooperation which I suspect is going from hearing impairment. She has good tone bulk and strength on the right side. There is no drift on the right side. Mild drift left upper extremity and a significant drift left leg. The strength on the left side is approximately 4/5 of the left upper extremity and also the left leg.  COORDINATION: Left finger to nose is normal, right finger to nose is normal, No rest tremor; no intention tremor; no postural tremor; no bradykinesia.  REFLEXES: Deep tendon reflexes are symmetrical and normal. Babinski reflexes are flexor bilaterally.   SENSATION: Reduced to pain on the left; there is evidence of significant neglect on the left.       Blood pressure (!) 158/56, pulse 63, temperature 98.1 F (36.7 C), temperature source Oral, resp. rate 20, height 5' 4"  (1.626 m), weight 155 lb (70.3 kg), SpO2 100 %.  Past Medical History:  Diagnosis Date  . Arthritis   . COPD (chronic obstructive pulmonary disease) ([place])   . Hypertension     Past Surgical History:  Procedure Laterality Date  . EYE SURGERY      History reviewed. No pertinent family history.  Social History:  reports that she has quit smoking. she has never used smokeless tobacco. She reports that she drinks alcohol.  She reports that she does not use drugs.  Allergies: No Known Allergies  Medications: Prior to Admission medications   Medication Sig Start Date End Date Taking? Authorizing Provider  bimatoprost (LUMIGAN) 0.01 % SOLN Place 1 drop into the left eye at bedtime.   Yes [provider]  brimonidine (ALPHAGAN) 0.2 % ophthalmic solution Place 1 drop into both eyes 2 (two) times daily.   Yes [provider]  Calcium Carbonate-Vitamin D (CALCIUM 500 + D) 500-125 MG-UNIT TABS Take 1 tablet by mouth daily.   Yes [provider]  denosumab (PROLIA) 60 MG/ML SOLN injection Inject 60 mg into the skin every 6 (six) months. Administer in upper arm, thigh, or abdomen   Yes [provider]  dorzolamide-timolol (COSOPT) 22.3-6.8 MG/ML ophthalmic solution Place 1 drop into both eyes 2 (two) times daily.   Yes [provider]  furosemide (LASIX) 40 MG tablet Take 40 mg by mouth daily.   Yes [provider]  HYDROcodone-acetaminophen (NORCO/VICODIN) 5-325 MG per tablet Take 1 tablet by mouth every 6 (six) hours as needed (pain).   Yes [provider]  losartan (COZAAR) 100 MG tablet Take 100 mg by mouth daily.   Yes [provider]  Multiple Vitamin (MULTIVITAMIN WITH MINERALS) TABS Take 1 tablet by mouth daily.   Yes [provider]  nabumetone (RELAFEN) 500 MG tablet Take 1,000 mg by mouth daily.   Yes [provider]  omega-3 acid ethyl esters (LOVAZA) 1 G capsule Take 1 g by mouth daily.   Yes [provider]  omeprazole (PRILOSEC) 20 MG capsule Take 20 mg by mouth daily.   Yes [provider]  raloxifene (EVISTA) 60 MG tablet Take 60 mg by mouth daily.   Yes [provider]  tiotropium (SPIRIVA) 18 MCG inhalation capsule Place 18 mcg into inhaler and inhale daily.   Yes [provider]  verapamil (VERELAN PM) 180 MG 24 hr capsule Take 360 mg by mouth at bedtime.   Yes [provider]  HYDROcodone-acetaminophen (NORCO/VICODIN) 5-325 MG per tablet Take 2 tablets by mouth every 4 (four) hours as needed. 05/20/14   Tanna Furry, MD  naproxen (NAPROSYN) 500 MG tablet Take 1 tablet (500 mg total) by mouth 2 (two) times daily. 05/20/14   Tanna Furry, MD    Scheduled Meds: . [START ON 05/10/2017] aspirin  300 mg Rectal Daily   Or  . [START ON 05/10/2017] aspirin  325 mg Oral Daily  . atorvastatin  80 mg Oral q1800  . brimonidine  1 drop Both Eyes BID  . dorzolamide-timolol  1 drop Both Eyes BID  . enoxaparin (LOVENOX) injection  40 mg Subcutaneous Q24H  . latanoprost  1 drop Left Eye QHS  . multivitamin with minerals  1 tablet Oral Daily  . omega-3 acid ethyl esters  1 g Oral Daily  . pantoprazole  40 mg Oral Daily  . raloxifene  60 mg Oral Daily  . tiotropium  18 mcg Inhalation Daily  . verapamil  360 mg Oral QHS   Continuous Infusions: . sodium chloride 50 mL/hr  at 05/09/17 1546   PRN Meds:.bisacodyl, ondansetron **OR** ondansetron (ZOFRAN) IV     Results for orders placed or performed during the hospital encounter of 05/09/17 (from the past 48 hour(s))  Urinalysis, Routine w reflex microscopic     Status: Abnormal   Collection Time: 05/09/17 11:59 AM  Result Value Ref Range   Color, Urine STRAW (A) YELLOW   APPearance CLEAR CLEAR   Specific Gravity, Urine 1.004 (L) 1.005 - 1.030   pH 6.0 5.0 - 8.0   Glucose, UA NEGATIVE NEGATIVE mg/dL   Hgb urine dipstick NEGATIVE NEGATIVE   Bilirubin Urine NEGATIVE NEGATIVE   Ketones, ur NEGATIVE NEGATIVE mg/dL   Protein, ur NEGATIVE NEGATIVE mg/dL   Nitrite NEGATIVE NEGATIVE   Leukocytes, UA NEGATIVE NEGATIVE  Comprehensive metabolic panel     Status: Abnormal   Collection Time: 05/09/17 11:59 AM  Result Value Ref Range   Sodium 141 135 - 145 mmol/L   Potassium 4.0 3.5 - 5.1 mmol/L   Chloride 113 (H) 101 - 111 mmol/L   CO2 22 22 - 32 mmol/L   Glucose, Bld 101 (H) 65 - 99 mg/dL   BUN 12 6 - 20 mg/dL    Creatinine, Ser 1.09 (H) 0.44 - 1.00 mg/dL   Calcium 9.0 8.9 - 10.3 mg/dL   Total Protein 7.5 6.5 - 8.1 g/dL   Albumin 3.5 3.5 - 5.0 g/dL   AST 24 15 - 41 U/L   ALT 9 (L) 14 - 54 U/L   Alkaline Phosphatase 31 (L) 38 - 126 U/L   Total Bilirubin 0.5 0.3 - 1.2 mg/dL   GFR calc non Af Amer 47 (L) >60 mL/min   GFR calc Af Amer 54 (L) >60 mL/min    Comment: (NOTE) The eGFR has been calculated using the CKD EPI equation. This calculation has not been validated in all clinical situations. eGFR's persistently <60 mL/min signify possible Chronic Kidney Disease.    Anion gap 6 5 - 15  Ethanol     Status: None   Collection Time: 05/09/17 11:59 AM  Result Value Ref Range   Alcohol, Ethyl (B) <10 <10 mg/dL    Comment:        LOWEST DETECTABLE LIMIT FOR SERUM ALCOHOL IS 10 mg/dL FOR MEDICAL PURPOSES ONLY   Protime-INR     Status: None   Collection Time: 05/09/17 11:59 AM  Result Value Ref Range   Prothrombin Time 13.0 11.4 - 15.2 seconds   INR 0.99   APTT     Status: None   Collection Time: 05/09/17 11:59 AM  Result Value Ref Range   aPTT 24 24 - 36 seconds  CBC with Differential     Status: None   Collection Time: 05/09/17 11:59 AM  Result Value Ref Range   WBC DUPLICATE CBC ALREADY DONE 4.0 - 33.5 K/uL   RBC DUPLICATE CBC ALREADY DONE 3.87 - 5.11 MIL/uL   Hemoglobin DUPLICATE CBC ALREADY DONE 12.0 - 45.6 g/dL   HCT DUPLICATE CBC ALREADY DONE 36.0 - 25.6 %   MCV DUPLICATE CBC ALREADY DONE 78.0 - 389.3 fL   MCH DUPLICATE CBC ALREADY DONE 26.0 - 73.4 pg   MCHC DUPLICATE CBC ALREADY DONE 30.0 - 28.7 g/dL   RDW DUPLICATE CBC ALREADY DONE 11.5 - 15.5 %   Platelets DUPLICATE CBC ALREADY DONE 150 - 400 K/uL   Neutrophils Relative % 61 %   Neutro Abs 5.0 1.7 - 7.7 K/uL   Lymphocytes Relative 27 %  Lymphs Abs 2.2 0.7 - 4.0 K/uL   Monocytes Relative 9 %   Monocytes Absolute 0.7 0.1 - 1.0 K/uL   Eosinophils Relative 3 %   Eosinophils Absolute 0.3 0.0 - 0.7 K/uL   Basophils Relative 0 %    Basophils Absolute 0.0 0.0 - 0.1 K/uL  Urine rapid drug screen (hosp performed)     Status: None   Collection Time: 05/09/17 12:10 PM  Result Value Ref Range   Opiates NONE DETECTED NONE DETECTED   Cocaine NONE DETECTED NONE DETECTED   Benzodiazepines NONE DETECTED NONE DETECTED   Amphetamines NONE DETECTED NONE DETECTED   Tetrahydrocannabinol NONE DETECTED NONE DETECTED   Barbiturates NONE DETECTED NONE DETECTED    Comment:        DRUG SCREEN FOR MEDICAL PURPOSES ONLY.  IF CONFIRMATION IS NEEDED FOR ANY PURPOSE, NOTIFY LAB WITHIN 5 DAYS.        LOWEST DETECTABLE LIMITS FOR URINE DRUG SCREEN Drug Class       Cutoff (ng/mL) Amphetamine      1000 Barbiturate      200 Benzodiazepine   197 Tricyclics       588 Opiates          300 Cocaine          300 THC              50   I-stat troponin, ED     Status: None   Collection Time: 05/09/17 12:17 PM  Result Value Ref Range   Troponin i, poc 0.01 0.00 - 0.08 ng/mL   Comment 3            Comment: Due to the release kinetics of cTnI, a negative result within the first hours of the onset of symptoms does not rule out myocardial infarction with certainty. If myocardial infarction is still suspected, repeat the test at appropriate intervals.   I-Stat Chem 8, ED     Status: Abnormal   Collection Time: 05/09/17 12:19 PM  Result Value Ref Range   Sodium 143 135 - 145 mmol/L   Potassium 4.2 3.5 - 5.1 mmol/L   Chloride 115 (H) 101 - 111 mmol/L   BUN 11 6 - 20 mg/dL   Creatinine, Ser 1.00 0.44 - 1.00 mg/dL   Glucose, Bld 98 65 - 99 mg/dL   Calcium, Ion 1.13 (L) 1.15 - 1.40 mmol/L   TCO2 19 (L) 22 - 32 mmol/L   Hemoglobin 12.2 12.0 - 15.0 g/dL   HCT 36.0 36.0 - 46.0 %    Studies/Results:  BRAIN MRA FINDINGS: Study is intermittently degraded by motion artifact despite repeated imaging attempts.  Antegrade flow in the posterior circulation with dominant appearing distal right vertebral artery. The distal left vertebral  artery appears to terminated in PICA. Patent vertebrobasilar junction and basilar artery without stenosis. Fetal type bilateral PCA origins, more so the right. No proximal PCA occlusion or stenosis but there is distal right PCA multifocal decreased flow signal which is possibly genuine in light of the MRI findings today reported separately. Distal left PCA flow appears within normal limits.  Antegrade flow in both ICA siphons. Carotid termini and bilateral posterior communicating artery origins are patent. MCA and left ACA origins are patent. The left A1 appears dominant and the right diminutive. Anterior communicating artery and proximal ACA branches are patent. Left MCA M1 segment, left MCA bifurcation and proximal left MCA branches are patent.  The right MCA M1 segment is patent, but there is  asymmetric decreased flow at the right MCA trifurcation and decreased flow signal in the right MCA M2 branches, especially the posterior division.  IMPRESSION: 1. Positive for occlusion of the Right MCA posterior right M2 branch. Asymmetric decreased flow signal in the other right MCA branches raising the possibility of thrombus or stenosis at the Right MCA bifurcation. 2. Positive also for fetal type PCA origins with distal Right PCA stenosis or occlusion. Note that this is concordant with the Right MCA/PCA watershed area infarct involvement on today MRI. 3. Motion degraded with no other large vessel occlusion or stenosis. Non dominant distal left vertebral artery appears to terminates in PICA. Dominant left A1 segment suspected.     BRAIN MRI FINDINGS: Brain: Confluent restricted diffusion throughout the posterior right MCA territory. Superior peri-Rolandic cortex relatively spared. Right parietal lobe, posterior insula, and posterior temporal lobe a especially are affected. Involvement of the right MCA PCA watershed territory including the lateral aspect and some of the  central right occipital lobe (series 1, image 83). Minimal cytotoxic edema associated. No associated acute hemorrhage. No intracranial mass effect at this time. Core infarct measures up to 6 cm longest dimension.  No contralateral left hemisphere restricted diffusion, but there are 2 small foci of right cerebellar hemisphere diffusion restriction on series 1 images 68 and 71. Unrelated chronic microhemorrhage in the right deep cerebellar nuclei. No cerebellar edema or acute hemorrhage.  Confluent bilateral cerebral white matter T2 and FLAIR hyperintensity. Chronic lacunar infarcts in the bilateral deep gray matter nuclei. No cortical encephalomalacia or other chronic cerebral blood products. No midline shift, mass effect, evidence of mass lesion, ventriculomegaly, extra-axial collection or acute intracranial hemorrhage. Cervicomedullary junction and pituitary are within normal limits.  Vascular: Major intracranial vascular flow voids are grossly preserved at the skullbase. There is abnormal increased FLAIR signal in posterior right MCA branch(es). See MRA findings today reported separately.  Skull and upper cervical spine: Normal bone marrow signal. Negative visible cervical spine.  Sinuses/Orbits: Postoperative changes to both globes.  Paranasal sinuses are stable and well pneumatized.  Other: Mild mastoid effusions. Visible scalp and face soft tissues appear negative.  IMPRESSION: 1. Moderate to large acute infarct in the posterior Right MCA territory, and affecting the right MCA/PCA watershed. Minimal edema with no hemorrhage or mass effect at this time. 2. Abnormal posterior Right MCA branches, see MRA today reported separately. 3. Two punctate acute infarcts also suspected in the right cerebellum. Therefore, consider a recent embolic event from the heart or proximal aorta.       The brain MRI scan is reviewed. There is confluent increased signal  involving the right parietal temporal region. This is a wedge-shaped be in a posterior watershed distribution. It extends to the insular cortex on the same side. This is associated with reduced signal on the ADC scan. There are 2 small areas of increased signal involving the inferior and no aspects of the cerebellum on the right side. There is severe confluent leukoencephalopathy suggesting confluent microvascular ischemic changes. No hemorrhages appreciated. MRA shows tight stenosis/occlusion of the distal M1 segment on the right side.    Tawfiq Favila A. Merlene Wilkinson, M.D.  Diplomate, [employer] of Psychiatry and Neurology ( Neurology). 05/09/2017, 6:56 PM

## 2017-05-09 NOTE — Progress Notes (Signed)
Received patient from ED, report received from Crystal B. RN. Vital signs stable, placed on telemetry box # 1. No c/o pain or discomfort noted. Will continue to monitor patient.

## 2017-05-09 NOTE — ED Provider Notes (Signed)
Emergency Department Provider Note   I have reviewed the triage vital signs and the nursing notes.   HISTORY  Chief Complaint Weakness   HPI [name] is a 82 y.o. female was unable to participate much in history but from nursing notes on that she was found on the floor this morning by her nephew.  Apparently last known normal was about 2 days ago.  Patient otherwise able to participate in history.  No family at bedside at this time.  Level V Caveat Secondary to Altered Mental Status    Past Medical History:  Diagnosis Date  . Arthritis   . COPD (chronic obstructive pulmonary disease) (HCC)   . Hypertension     Patient Active Problem List   Diagnosis Date Noted  . Acute embolic stroke (HCC) 05/09/2017  . Stroke (HCC) 05/09/2017  . Falls frequently   . Essential hypertension   . Acute metabolic encephalopathy   . Glaucoma     Past Surgical History:  Procedure Laterality Date  . EYE SURGERY        Allergies Patient has no known allergies.  History reviewed. No pertinent family history.  Social History Social History   Tobacco Use  . Smoking status: Former Games developer  . Smokeless tobacco: Never Used  Substance Use Topics  . Alcohol use: Yes    Comment: occ  . Drug use: No    Review of Systems  Level V Caveat Secondary to Altered Mental Status ____________________________________________   PHYSICAL EXAM:  VITAL SIGNS: ED Triage Vitals [05/09/17 1154]  Blood pressure (!) 135/53, pulse 67, temperature 98.2 F (36.8 C), resp. rate (!) 21, height 5\' 4"  (1.626 m), weight 70.3 kg (155 lb), SpO2 98 %.   Constitutional: Alert and disoriented. Well appearing and in no acute distress. Eyes: Conjunctivae are normal. PERRL. EOMI. Sunken eyes Head: Atraumatic. Nose: No congestion/rhinnorhea. Mouth/Throat: Mucous membranes are dry.  Oropharynx non-erythematous. Neck: No stridor.  No meningeal signs.   Cardiovascular: Normal rate, regular rhythm.  Good peripheral circulation. Grossly normal heart sounds.   Respiratory: Normal respiratory effort.  No retractions. Lungs CTAB. Gastrointestinal: Soft and nontender. No distention.  Musculoskeletal: No lower extremity tenderness nor edema. No gross deformities of extremities. Neurologic:  Normal speech. When looking straight, left eye lateral deviated but nas normal EOM otherwise. Mild left facial droop when smiling. Left grip strength decreased. Apparently decreased sensation on left lower leg. Decreased vision, but unable to tell what her baseline is.  Skin:  Skin is warm, dry and intact. No rash noted.   ____________________________________________   LABS (all labs ordered are listed, but only abnormal results are displayed)  Labs Reviewed  URINALYSIS, ROUTINE W REFLEX MICROSCOPIC - Abnormal; Notable for the following components:      Result Value   Color, Urine STRAW (*)    Specific Gravity, Urine 1.004 (*)    All other components within normal limits  COMPREHENSIVE METABOLIC PANEL - Abnormal; Notable for the following components:   Chloride 113 (*)    Glucose, Bld 101 (*)    Creatinine, Ser 1.09 (*)    ALT 9 (*)    Alkaline Phosphatase 31 (*)    GFR calc non Af Amer 47 (*)    GFR calc Af Amer 54 (*)    All other components within normal limits  I-STAT CHEM 8, ED - Abnormal; Notable for the following components:   Chloride 115 (*)    Calcium, Ion 1.13 (*)    TCO2 19 (*)  All other components within normal limits  ETHANOL  PROTIME-INR  APTT  RAPID URINE DRUG SCREEN, HOSP PERFORMED  CBC WITH DIFFERENTIAL/PLATELET  DIFFERENTIAL  HEMOGLOBIN A1C  LIPID PANEL  BASIC METABOLIC PANEL  CBC  I-STAT TROPONIN, ED   ____________________________________________  EKG   EKG Interpretation  Date/Time:  Tuesday May 09 2017 11:55:32 EST Ventricular Rate:  66 PR Interval:    QRS Duration: 98 QT Interval:  408 QTC Calculation: 428 R Axis:   41 Text Interpretation:   Sinus rhythm No significant change since last tracing Confirmed by Marily MemosMesner, Jaselyn Nahm [phone]([id number]) on 05/09/2017 12:44:21 PM       ____________________________________________  RADIOLOGY  Mr Maxine GlennMra Head Wo Contrast  Result Date: 05/09/2017 CLINICAL DATA:  82 year old female code stroke presentation with left side symptoms and suspected acute right MCA territory infarct on noncontrast head CT earlier today. EXAM: MRA HEAD WITHOUT CONTRAST TECHNIQUE: Angiographic images of the Circle of Willis were obtained using MRA technique without intravenous contrast. COMPARISON:  Brain MRI today reported separately. FINDINGS: Study is intermittently degraded by motion artifact despite repeated imaging attempts. Antegrade flow in the posterior circulation with dominant appearing distal right vertebral artery. The distal left vertebral artery appears to terminated in PICA. Patent vertebrobasilar junction and basilar artery without stenosis. Fetal type bilateral PCA origins, more so the right. No proximal PCA occlusion or stenosis but there is distal right PCA multifocal decreased flow signal which is possibly genuine in light of the MRI findings today reported separately. Distal left PCA flow appears within normal limits. Antegrade flow in both ICA siphons. Carotid termini and bilateral posterior communicating artery origins are patent. MCA and left ACA origins are patent. The left A1 appears dominant and the right diminutive. Anterior communicating artery and proximal ACA branches are patent. Left MCA M1 segment, left MCA bifurcation and proximal left MCA branches are patent. The right MCA M1 segment is patent, but there is asymmetric decreased flow at the right MCA trifurcation and decreased flow signal in the right MCA M2 branches, especially the posterior division. IMPRESSION: 1. Positive for occlusion of the Right MCA posterior right M2 branch. Asymmetric decreased flow signal in the other right MCA branches raising the  possibility of thrombus or stenosis at the Right MCA bifurcation. 2. Positive also for fetal type PCA origins with distal Right PCA stenosis or occlusion. Note that this is concordant with the Right MCA/PCA watershed area infarct involvement on today MRI. 3. Motion degraded with no other large vessel occlusion or stenosis. Non dominant distal left vertebral artery appears to terminates in PICA. Dominant left A1 segment suspected. Electronically Signed   By: Odessa FlemingH  Hall M.D.   On: 05/09/2017 15:46   Mr Brain Wo Contrast  Addendum Date: 05/09/2017   ADDENDUM REPORT: 05/09/2017 16:09 ADDENDUM: I briefly discussed the salient MRI and intracranial MRA findings on this patient with RN [name] on 05/09/2017 at 1600 hours. Electronically Signed   By: Odessa FlemingH  Hall M.D.   On: 05/09/2017 16:09   Result Date: 05/09/2017 CLINICAL DATA:  82 year old female code stroke presentation with left side symptoms and suspected acute right MCA territory infarct on noncontrast head CT earlier today. EXAM: MRI HEAD WITHOUT CONTRAST TECHNIQUE: Multiplanar, multiecho pulse sequences of the brain and surrounding structures were obtained without intravenous contrast. COMPARISON:  Noncontrast head CT 1221 hours today. FINDINGS: Brain: Confluent restricted diffusion throughout the posterior right MCA territory. Superior peri-Rolandic cortex relatively spared. Right parietal lobe, posterior insula, and posterior temporal lobe a especially are affected.  Involvement of the right MCA PCA watershed territory including the lateral aspect and some of the central right occipital lobe (series 1, image 83). Minimal cytotoxic edema associated. No associated acute hemorrhage. No intracranial mass effect at this time. Core infarct measures up to 6 cm longest dimension. No contralateral left hemisphere restricted diffusion, but there are 2 small foci of right cerebellar hemisphere diffusion restriction on series 1 images 68 and 71. Unrelated chronic  microhemorrhage in the right deep cerebellar nuclei. No cerebellar edema or acute hemorrhage. Confluent bilateral cerebral white matter T2 and FLAIR hyperintensity. Chronic lacunar infarcts in the bilateral deep gray matter nuclei. No cortical encephalomalacia or other chronic cerebral blood products. No midline shift, mass effect, evidence of mass lesion, ventriculomegaly, extra-axial collection or acute intracranial hemorrhage. Cervicomedullary junction and pituitary are within normal limits. Vascular: Major intracranial vascular flow voids are grossly preserved at the skullbase. There is abnormal increased FLAIR signal in posterior right MCA branch(es). See MRA findings today reported separately. Skull and upper cervical spine: Normal bone marrow signal. Negative visible cervical spine. Sinuses/Orbits: Postoperative changes to both globes. Paranasal sinuses are stable and well pneumatized. Other: Mild mastoid effusions. Visible scalp and face soft tissues appear negative. IMPRESSION: 1. Moderate to large acute infarct in the posterior Right MCA territory, and affecting the right MCA/PCA watershed. Minimal edema with no hemorrhage or mass effect at this time. 2. Abnormal posterior Right MCA branches, see MRA today reported separately. 3. Two punctate acute infarcts also suspected in the right cerebellum. Therefore, consider a recent embolic event from the heart or proximal aorta. Electronically Signed: By: Odessa Fleming M.D. On: 05/09/2017 15:40   Dg Chest Port 1 View  Result Date: 05/09/2017 CLINICAL DATA:  Cough. EXAM: PORTABLE CHEST 1 VIEW COMPARISON:  Radiographs of June 17, 2015. FINDINGS: Mild cardiomegaly is noted with mild central pulmonary vascular congestion. Atherosclerosis of thoracic aorta is noted. No pneumothorax or pleural effusion is noted. Bony thorax is unremarkable. No consolidative process is noted. IMPRESSION: Mild cardiomegaly with mild central pulmonary vascular congestion. Aortic  atherosclerosis. Electronically Signed   By: Lupita Raider, M.D.   On: 05/09/2017 13:04   Ct Head Code Stroke Wo Contrast  Result Date: 05/09/2017 CLINICAL DATA:  Code stroke. Left-sided weakness. Left facial droop. EXAM: CT HEAD WITHOUT CONTRAST TECHNIQUE: Contiguous axial images were obtained from the base of the skull through the vertex without intravenous contrast. COMPARISON:  02/13/2008 FINDINGS: Brain: There is a small area of decreased gray-white differentiation in the supraganglionic right frontoparietal region which may reflect an acute infarct. There is increased patchy low attenuation in the right lentiform nucleus compared to the prior CT. There is chronic hypoattenuation in the right internal capsule. A chronic lacunar infarct is again seen in the left lentiform nucleus, although there may be an additional new age indeterminate left lentiform nucleus lacunar infarct, and there also appear to be new age indeterminate lacunar infarcts in both thalami. Patchy to confluent hypodensities throughout the cerebral white matter bilaterally have mildly progressed and are compatible with moderately age advanced chronic small vessel ischemia. No acute intracranial hemorrhage, mass, midline shift, or extra-axial fluid collection is identified. Vascular: Calcified atherosclerosis at the skullbase. Asymmetric increased density of the right MCA at the M2 level. Skull: No fracture focal osseous lesion. Sinuses/Orbits: Visualized paranasal sinuses and mastoid air cells are clear. Bilateral cataract extraction. Other: None. ASPECTS [facility] Stroke Program Early CT Score) - Ganglionic level infarction (caudate, lentiform nuclei, internal capsule, insula, M1-M3 cortex): 6-7 (  age indeterminate right lentiform nucleus infarct) - Supraganglionic infarction (M4-M6 cortex): 2 Total score (0-10 with 10 being normal): 8-9 IMPRESSION: 1. Suspected acute right MCA infarct involving right frontoparietal cortex and possibly  right lentiform nucleus. No hemorrhage. 2. ASPECTS is 8-9. 3. Hyperdense right MCA at the M2 level which may reflect acute embolus. 4. Progressive chronic small vessel ischemia including new age indeterminate lacunar infarcts in the deep gray nuclei bilaterally. These results were called by telephone at the time of interpretation on 05/09/2017 at 12:51 pm to Dr. Marily MemosJASON Beonka Amesquita , who verbally acknowledged these results. Electronically Signed   By: Sebastian AcheAllen  Grady M.D.   On: 05/09/2017 12:54    ____________________________________________   PROCEDURES  Procedure(s) performed:   Procedures   ____________________________________________   INITIAL IMPRESSION / ASSESSMENT AND PLAN / ED COURSE  Pertinent labs & imaging results that were available during my care of the patient were reviewed by me and considered in my medical decision making (see chart for details).  Suspect likely stroke. Will eval appropriately. LKN was >24 hours ago, no code stroke, no tPA, no obvious neuro intervention candidate.   CT evidence of acute ischemic stroke.  Plan for admission to hospitalist.  ____________________________________________  FINAL CLINICAL IMPRESSION(S) / ED DIAGNOSES  Final diagnoses:  Cerebrovascular accident (CVA), unspecified mechanism (HCC)     MEDICATIONS GIVEN DURING THIS VISIT:  Medications  aspirin suppository 300 mg (not administered)    Or  aspirin tablet 325 mg (not administered)  atorvastatin (LIPITOR) tablet 80 mg (80 mg Oral Not Given 05/09/17 1800)  enoxaparin (LOVENOX) injection 40 mg (40 mg Subcutaneous Given 05/09/17 1754)  0.9 %  sodium chloride infusion ( Intravenous New Bag/Given 05/09/17 1546)  ondansetron (ZOFRAN) tablet 4 mg (not administered)    Or  ondansetron (ZOFRAN) injection 4 mg (not administered)  bisacodyl (DULCOLAX) EC tablet 5 mg (not administered)  brimonidine (ALPHAGAN) 0.2 % ophthalmic solution 1 drop (not administered)  latanoprost (XALATAN) 0.005  % ophthalmic solution 1 drop (not administered)  dorzolamide-timolol (COSOPT) 22.3-6.8 MG/ML ophthalmic solution 1 drop (not administered)  multivitamin with minerals tablet 1 tablet (1 tablet Oral Not Given 05/09/17 1800)  omega-3 acid ethyl esters (LOVAZA) capsule 1 g (1 g Oral Not Given 05/09/17 1800)  pantoprazole (PROTONIX) EC tablet 40 mg (40 mg Oral Not Given 05/09/17 1800)  raloxifene (EVISTA) tablet 60 mg (60 mg Oral Not Given 05/09/17 1801)  tiotropium (SPIRIVA) inhalation capsule 18 mcg (not administered)  verapamil (CALAN-SR) CR tablet 360 mg (not administered)  sodium chloride 0.9 % bolus 1,000 mL (1,000 mLs Intravenous Transfusing/Transfer 05/09/17 1437)   stroke: mapping our early stages of recovery book ( Does not apply Given 05/09/17 1802)  aspirin suppository 300 mg (300 mg Rectal Given 05/09/17 1443)     NEW OUTPATIENT MEDICATIONS STARTED DURING THIS VISIT:  This SmartLink is deprecated. Use AVSMEDLIST instead to display the medication list for a patient.  Note:  This document was prepared using Dragon voice recognition software and may include unintentional dictation errors.   Marily MemosMesner, Lavella Myren, MD 05/09/17 1924

## 2017-05-09 NOTE — Progress Notes (Signed)
Received call from Dr Margo AyeHall regarding patient's MRI/ MRA, results called to me, MD said he had tried to get in touch with Dr Gonzella Lexhungel, but was not successful. I paged Dr Gonzella Lexhungel at this, awaiting call. Will continue to monitor patient.

## 2017-05-09 NOTE — ED Notes (Signed)
Patient transported to MR. 

## 2017-05-09 NOTE — Progress Notes (Signed)
MRI findings reviewed. Will await further recommendations from neurology.

## 2017-05-09 NOTE — ED Triage Notes (Signed)
PT was found in the floor this am by her nephrew that had to break down the door to get in. PT last known normal was 2 days ago via telephone. Today pt noted to have some abnormal gait, left sided weakness, and left sided facial droop by EMS. PT also stated she's had multiple falls and difficulty with ambulation started last night.

## 2017-05-10 ENCOUNTER — Inpatient Hospital Stay (HOSPITAL_COMMUNITY): Payer: Medicare Other

## 2017-05-10 DIAGNOSIS — I340 Nonrheumatic mitral (valve) insufficiency: Secondary | ICD-10-CM

## 2017-05-10 LAB — BASIC METABOLIC PANEL
Anion gap: 8 (ref 5–15)
BUN: 12 mg/dL (ref 6–20)
CHLORIDE: 112 mmol/L — AB (ref 101–111)
CO2: 20 mmol/L — ABNORMAL LOW (ref 22–32)
Calcium: 8.6 mg/dL — ABNORMAL LOW (ref 8.9–10.3)
Creatinine, Ser: 0.91 mg/dL (ref 0.44–1.00)
GFR, EST NON AFRICAN AMERICAN: 58 mL/min — AB (ref >60)
Glucose, Bld: 91 mg/dL (ref 65–99)
POTASSIUM: 3.8 mmol/L (ref 3.5–5.1)
SODIUM: 140 mmol/L (ref 135–145)

## 2017-05-10 LAB — LIPID PANEL
Cholesterol: 159 mg/dL (ref 0–200)
HDL: 55 mg/dL (ref >40)
LDL CALC: 82 mg/dL (ref 0–99)
Total CHOL/HDL Ratio: 2.9 RATIO
Triglycerides: 109 mg/dL (ref <150)
VLDL: 22 mg/dL (ref 0–40)

## 2017-05-10 LAB — CBC
HEMATOCRIT: 34.3 % — AB (ref 36.0–46.0)
Hemoglobin: 10.4 g/dL — ABNORMAL LOW (ref 12.0–15.0)
MCH: 25.8 pg — ABNORMAL LOW (ref 26.0–34.0)
MCHC: 30.3 g/dL (ref 30.0–36.0)
MCV: 85.1 fL (ref 78.0–100.0)
PLATELETS: 184 10*3/uL (ref 150–400)
RBC: 4.03 MIL/uL (ref 3.87–5.11)
RDW: 15.3 % (ref 11.5–15.5)
WBC: 7 10*3/uL (ref 4.0–10.5)

## 2017-05-10 LAB — HEMOGLOBIN A1C
HEMOGLOBIN A1C: 6 % — AB (ref 4.8–5.6)
MEAN PLASMA GLUCOSE: 125.5 mg/dL

## 2017-05-10 NOTE — Evaluation (Signed)
Physical Therapy Evaluation Patient Details Name: [name] MRN: [id number] DOB: [birth year] Today's Date: 05/10/2017   History of Present Illness  [name]  is a [age] y.o. female, history of arthritis, hypertension and COPD was brought to the hospital after she was found on the floor.  She was last known to be normal about 16 hours back ?last evening when 1 of her neice spoke with her on the phone.  She did not answer her door this morning and her nephew had to break down the door to get in.  EMS found patient to have abnormal gait, left-sided weakness and left-sided facial droop.  Patient was alert during my conversation but had impaired vision on her left eye and left lateral gaze.  She informs that she was having frequent falls at home the past week about 3-4 falls yesterday itself.  Patient lives alone and is fairly independent and has not had similar symptoms in the past.    Clinical Impression  Patient is presently a fall risk due to lack of awareness and visual perception on left side, frequently bumping into nearby objects and walls on the left during gait training.  When patient concentrates she can functionally use LUE with verbal cueing and given rolled up towel to squeeze to increase awareness of LUE and left side.  Patient will benefit from continued physical therapy in hospital and recommended venue below to increase strength, balance, endurance for safe ADLs and gait.    Follow Up Recommendations SNF;Supervision/Assistance - 24 hour    Equipment Recommendations  Rolling walker with 5" wheels    Recommendations for Other Services       Precautions / Restrictions Precautions Precautions: Fall Restrictions Weight Bearing Restrictions: No      Mobility  Bed Mobility Overal bed mobility: Modified Independent                Transfers Overall transfer level: Needs assistance Equipment used: Straight cane Transfers: Sit to/from Stand;Stand Pivot  Transfers Sit to Stand: Min guard Stand pivot transfers: Min guard       General transfer comment: demonstrates occasional neglict of left side  Ambulation/Gait Ambulation/Gait assistance: Min assist Ambulation Distance (Feet): 40 Feet Assistive device: Straight cane Gait Pattern/deviations: Decreased step length - left;Decreased stride length   Gait velocity interpretation: Below normal speed for age/gender General Gait Details: slow slightly labored cadence with lack of awareness on left side, frequently running into walls and objects on the left side  Stairs            Wheelchair Mobility    Modified Rankin (Stroke Patients Only)       Balance Overall balance assessment: Needs assistance Sitting-balance support: No upper extremity supported;Feet supported Sitting balance-Leahy Scale: Good     Standing balance support: No upper extremity supported;Single extremity supported;During functional activity Standing balance-Leahy Scale: Fair                               Pertinent Vitals/Pain Pain Assessment: No/denies pain    Home Living Family/patient expects to be discharged to:: Private residence Living Arrangements: Alone Available Help at Discharge: Family Type of Home: House Home Access: Stairs to enter Entrance Stairs-Rails: Right;Left;Can reach both Entrance Stairs-Number of Steps: 4 in the back, 8 in the front Home Layout: One level Home Equipment: Shower seat;Cane - single point      Prior Function Level of Independence: Independent with assistive device(s)  Comments: Used SPC for functional mobility, independent with ADLs     Hand Dominance        Extremity/Trunk Assessment   Upper Extremity Assessment Upper Extremity Assessment: Defer to OT evaluation    Lower Extremity Assessment Lower Extremity Assessment: Overall WFL for tasks assessed;LLE deficits/detail LLE Deficits / Details: grossly 4/5, decreased  sensation        Communication   Communication: HOH  Cognition Arousal/Alertness: Awake/alert Behavior During Therapy: WFL for tasks assessed/performed Overall Cognitive Status: Within Functional Limits for tasks assessed                                        General Comments      Exercises     Assessment/Plan    PT Assessment Patient needs continued PT services  PT Problem List Decreased strength;Decreased balance;Decreased mobility;Impaired sensation;Decreased coordination       PT Treatment Interventions Gait training;Stair training;Functional mobility training;Therapeutic activities;Therapeutic exercise;Neuromuscular re-education;Patient/family education    PT Goals (Current goals can be found in the Care Plan section)  Acute Rehab PT Goals Patient Stated Goal: return home  PT Goal Formulation: With patient Time For Goal Achievement: 05/15/17 Potential to Achieve Goals: Good    Frequency 7X/week   Barriers to discharge        Co-evaluation               AM-PAC PT "6 Clicks" Daily Activity  Outcome Measure Difficulty turning over in bed (including adjusting bedclothes, sheets and blankets)?: None Difficulty moving from lying on back to sitting on the side of the bed? : None Difficulty sitting down on and standing up from a chair with arms (e.g., wheelchair, bedside commode, etc,.)?: None Help needed moving to and from a bed to chair (including a wheelchair)?: A Little Help needed walking in hospital room?: A Little Help needed climbing 3-5 steps with a railing? : A Little 6 Click Score: 21    End of Session   Activity Tolerance: Patient tolerated treatment well;Patient limited by fatigue Patient left: in bed;with call bell/phone within reach Nurse Communication: Mobility status PT Visit Diagnosis: Unsteadiness on feet (R26.81);Other abnormalities of gait and mobility (R26.89);Muscle weakness (generalized) (M62.81)    Time:  [id number] PT Time Calculation (min) (ACUTE ONLY): 29 min   Charges:   PT Evaluation $PT Eval Moderate Complexity: 1 Mod PT Treatments $Therapeutic Activity: 8-22 mins   PT G Codes:   PT G-Codes **NOT FOR INPATIENT CLASS** Functional Assessment Tool Used: AM-PAC 6 Clicks Basic Mobility Functional Limitation: Mobility: Walking and moving around Mobility: Walking and Moving Around Current Status ([id number]): At least 20 percent but less than 40 percent impaired, limited or restricted Mobility: Walking and Moving Around Goal Status [phone]): At least 20 percent but less than 40 percent impaired, limited or restricted Mobility: Walking and Moving Around Discharge Status [phone]([id number]): At least 20 percent but less than 40 percent impaired, limited or restricted    1:35 PM, 05/10/17 Ocie BobJames Findlay Dagher, MPT Physical Therapist with [phone] office [phone] mobile phone

## 2017-05-10 NOTE — Progress Notes (Signed)
Subjective: She was admitted yesterday with a stroke.  Neurology consultation noted and appreciated.  She is having OT PT and speech consults as well.  She has no new complaints.  She is still weak in her leg.  She seems to have a visual field defect.  She is very hard of hearing which makes communication difficult  Objective: Vital signs in last 24 hours: Temp:  [97.5 F (36.4 C)-99.1 F (37.3 C)] 99.1 F (37.3 C) (11/21 0750) Pulse Rate:  [55-72] 64 (11/21 0750) Resp:  [16-21] 16 (11/21 0750) BP: (121-164)/(38-73) 147/55 (11/21 0750) SpO2:  [97 %-100 %] 100 % (11/21 0750) Weight:  [70.3 kg (155 lb)] 70.3 kg (155 lb) (11/20 1154) Weight change:  Last BM Date: 05/05/17  Intake/Output from previous day: 11/20 0701 - 11/21 0700 In: 561.7 [I.V.:561.7] Out: 400 [Urine:400]  PHYSICAL EXAM General appearance: alert, cooperative and mild distress Resp: clear to auscultation bilaterally Cardio: regular rate and rhythm, S1, S2 normal, no murmur, click, rub or gallop GI: soft, non-tender; bowel sounds normal; no masses,  no organomegaly Extremities: extremities normal, atraumatic, no cyanosis or edema Weakness of her left leg more than arm  Lab Results:  Results for orders placed or performed during the hospital encounter of 05/09/17 (from the past 48 hour(s))  Urinalysis, Routine w reflex microscopic     Status: Abnormal   Collection Time: 05/09/17 11:59 AM  Result Value Ref Range   Color, Urine STRAW (A) YELLOW   APPearance CLEAR CLEAR   Specific Gravity, Urine 1.004 (L) 1.005 - 1.030   pH 6.0 5.0 - 8.0   Glucose, UA NEGATIVE NEGATIVE mg/dL   Hgb urine dipstick NEGATIVE NEGATIVE   Bilirubin Urine NEGATIVE NEGATIVE   Ketones, ur NEGATIVE NEGATIVE mg/dL   Protein, ur NEGATIVE NEGATIVE mg/dL   Nitrite NEGATIVE NEGATIVE   Leukocytes, UA NEGATIVE NEGATIVE  Comprehensive metabolic panel     Status: Abnormal   Collection Time: 05/09/17 11:59 AM  Result Value Ref Range   Sodium 141  135 - 145 mmol/L   Potassium 4.0 3.5 - 5.1 mmol/L   Chloride 113 (H) 101 - 111 mmol/L   CO2 22 22 - 32 mmol/L   Glucose, Bld 101 (H) 65 - 99 mg/dL   BUN 12 6 - 20 mg/dL   Creatinine, Ser 1.09 (H) 0.44 - 1.00 mg/dL   Calcium 9.0 8.9 - 10.3 mg/dL   Total Protein 7.5 6.5 - 8.1 g/dL   Albumin 3.5 3.5 - 5.0 g/dL   AST 24 15 - 41 U/L   ALT 9 (L) 14 - 54 U/L   Alkaline Phosphatase 31 (L) 38 - 126 U/L   Total Bilirubin 0.5 0.3 - 1.2 mg/dL   GFR calc non Af Amer 47 (L) >60 mL/min   GFR calc Af Amer 54 (L) >60 mL/min    Comment: (NOTE) The eGFR has been calculated using the CKD EPI equation. This calculation has not been validated in all clinical situations. eGFR's persistently <60 mL/min signify possible Chronic Kidney Disease.    Anion gap 6 5 - 15  Ethanol     Status: None   Collection Time: 05/09/17 11:59 AM  Result Value Ref Range   Alcohol, Ethyl (B) <10 <10 mg/dL    Comment:        LOWEST DETECTABLE LIMIT FOR SERUM ALCOHOL IS 10 mg/dL FOR MEDICAL PURPOSES ONLY   Protime-INR     Status: None   Collection Time: 05/09/17 11:59 AM  Result Value Ref  Range   Prothrombin Time 13.0 11.4 - 15.2 seconds   INR 0.99   APTT     Status: None   Collection Time: 05/09/17 11:59 AM  Result Value Ref Range   aPTT 24 24 - 36 seconds  CBC with Differential     Status: None   Collection Time: 05/09/17 11:59 AM  Result Value Ref Range   WBC DUPLICATE CBC ALREADY DONE 4.0 - 35.3 K/uL   RBC DUPLICATE CBC ALREADY DONE 3.87 - 5.11 MIL/uL   Hemoglobin DUPLICATE CBC ALREADY DONE 12.0 - 61.4 g/dL   HCT DUPLICATE CBC ALREADY DONE 36.0 - 43.1 %   MCV DUPLICATE CBC ALREADY DONE 78.0 - 540.0 fL   MCH DUPLICATE CBC ALREADY DONE 26.0 - 86.7 pg   MCHC DUPLICATE CBC ALREADY DONE 30.0 - 61.9 g/dL   RDW DUPLICATE CBC ALREADY DONE 11.5 - 15.5 %   Platelets DUPLICATE CBC ALREADY DONE 150 - 400 K/uL   Neutrophils Relative % 61 %   Neutro Abs 5.0 1.7 - 7.7 K/uL   Lymphocytes Relative 27 %   Lymphs Abs 2.2  0.7 - 4.0 K/uL   Monocytes Relative 9 %   Monocytes Absolute 0.7 0.1 - 1.0 K/uL   Eosinophils Relative 3 %   Eosinophils Absolute 0.3 0.0 - 0.7 K/uL   Basophils Relative 0 %   Basophils Absolute 0.0 0.0 - 0.1 K/uL  Urine rapid drug screen (hosp performed)     Status: None   Collection Time: 05/09/17 12:10 PM  Result Value Ref Range   Opiates NONE DETECTED NONE DETECTED   Cocaine NONE DETECTED NONE DETECTED   Benzodiazepines NONE DETECTED NONE DETECTED   Amphetamines NONE DETECTED NONE DETECTED   Tetrahydrocannabinol NONE DETECTED NONE DETECTED   Barbiturates NONE DETECTED NONE DETECTED    Comment:        DRUG SCREEN FOR MEDICAL PURPOSES ONLY.  IF CONFIRMATION IS NEEDED FOR ANY PURPOSE, NOTIFY LAB WITHIN 5 DAYS.        LOWEST DETECTABLE LIMITS FOR URINE DRUG SCREEN Drug Class       Cutoff (ng/mL) Amphetamine      1000 Barbiturate      200 Benzodiazepine   509 Tricyclics       326 Opiates          300 Cocaine          300 THC              50   I-stat troponin, ED     Status: None   Collection Time: 05/09/17 12:17 PM  Result Value Ref Range   Troponin i, poc 0.01 0.00 - 0.08 ng/mL   Comment 3            Comment: Due to the release kinetics of cTnI, a negative result within the first hours of the onset of symptoms does not rule out myocardial infarction with certainty. If myocardial infarction is still suspected, repeat the test at appropriate intervals.   I-Stat Chem 8, ED     Status: Abnormal   Collection Time: 05/09/17 12:19 PM  Result Value Ref Range   Sodium 143 135 - 145 mmol/L   Potassium 4.2 3.5 - 5.1 mmol/L   Chloride 115 (H) 101 - 111 mmol/L   BUN 11 6 - 20 mg/dL   Creatinine, Ser 1.00 0.44 - 1.00 mg/dL   Glucose, Bld 98 65 - 99 mg/dL   Calcium, Ion 1.13 (L) 1.15 - 1.40 mmol/L  TCO2 19 (L) 22 - 32 mmol/L   Hemoglobin 12.2 12.0 - 15.0 g/dL   HCT 36.0 36.0 - 46.0 %  Lipid panel     Status: None   Collection Time: 05/10/17  5:49 AM  Result Value Ref  Range   Cholesterol 159 0 - 200 mg/dL   Triglycerides 109 <150 mg/dL   HDL 55 >40 mg/dL   Total CHOL/HDL Ratio 2.9 RATIO   VLDL 22 0 - 40 mg/dL   LDL Cholesterol 82 0 - 99 mg/dL    Comment:        Total Cholesterol/HDL:CHD Risk Coronary Heart Disease Risk Table                     Men   Women  1/2 Average Risk   3.4   3.3  Average Risk       5.0   4.4  2 X Average Risk   9.6   7.1  3 X Average Risk  23.4   11.0        Use the calculated Patient Ratio above and the CHD Risk Table to determine the patient's CHD Risk.        ATP III CLASSIFICATION (LDL):  <100     mg/dL   Optimal  100-129  mg/dL   Near or Above                    Optimal  130-159  mg/dL   Borderline  160-189  mg/dL   High  >190     mg/dL   Very High   Basic metabolic panel     Status: Abnormal   Collection Time: 05/10/17  5:49 AM  Result Value Ref Range   Sodium 140 135 - 145 mmol/L   Potassium 3.8 3.5 - 5.1 mmol/L   Chloride 112 (H) 101 - 111 mmol/L   CO2 20 (L) 22 - 32 mmol/L   Glucose, Bld 91 65 - 99 mg/dL   BUN 12 6 - 20 mg/dL   Creatinine, Ser 0.91 0.44 - 1.00 mg/dL   Calcium 8.6 (L) 8.9 - 10.3 mg/dL   GFR calc non Af Amer 58 (L) >60 mL/min   GFR calc Af Amer >60 >60 mL/min    Comment: (NOTE) The eGFR has been calculated using the CKD EPI equation. This calculation has not been validated in all clinical situations. eGFR's persistently <60 mL/min signify possible Chronic Kidney Disease.    Anion gap 8 5 - 15  CBC     Status: Abnormal   Collection Time: 05/10/17  5:49 AM  Result Value Ref Range   WBC 7.0 4.0 - 10.5 K/uL   RBC 4.03 3.87 - 5.11 MIL/uL   Hemoglobin 10.4 (L) 12.0 - 15.0 g/dL   HCT 34.3 (L) 36.0 - 46.0 %   MCV 85.1 78.0 - 100.0 fL   MCH 25.8 (L) 26.0 - 34.0 pg   MCHC 30.3 30.0 - 36.0 g/dL   RDW 15.3 11.5 - 15.5 %   Platelets 184 150 - 400 K/uL    [facility] Recent Labs    05/09/17 1219  TCO2 19*   CULTURES No results found for this or any previous visit (from the past 240  hour(s)). Studies/Results: Mr Virgel Paling OB Contrast  Result Date: 05/09/2017 CLINICAL DATA:  82 year old female code stroke presentation with left side symptoms and suspected acute right MCA territory infarct on noncontrast head CT earlier today. EXAM: MRA HEAD WITHOUT CONTRAST  TECHNIQUE: Angiographic images of the Circle of Willis were obtained using MRA technique without intravenous contrast. COMPARISON:  Brain MRI today reported separately. FINDINGS: Study is intermittently degraded by motion artifact despite repeated imaging attempts. Antegrade flow in the posterior circulation with dominant appearing distal right vertebral artery. The distal left vertebral artery appears to terminated in PICA. Patent vertebrobasilar junction and basilar artery without stenosis. Fetal type bilateral PCA origins, more so the right. No proximal PCA occlusion or stenosis but there is distal right PCA multifocal decreased flow signal which is possibly genuine in light of the MRI findings today reported separately. Distal left PCA flow appears within normal limits. Antegrade flow in both ICA siphons. Carotid termini and bilateral posterior communicating artery origins are patent. MCA and left ACA origins are patent. The left A1 appears dominant and the right diminutive. Anterior communicating artery and proximal ACA branches are patent. Left MCA M1 segment, left MCA bifurcation and proximal left MCA branches are patent. The right MCA M1 segment is patent, but there is asymmetric decreased flow at the right MCA trifurcation and decreased flow signal in the right MCA M2 branches, especially the posterior division. IMPRESSION: 1. Positive for occlusion of the Right MCA posterior right M2 branch. Asymmetric decreased flow signal in the other right MCA branches raising the possibility of thrombus or stenosis at the Right MCA bifurcation. 2. Positive also for fetal type PCA origins with distal Right PCA stenosis or occlusion. Note that  this is concordant with the Right MCA/PCA watershed area infarct involvement on today MRI. 3. Motion degraded with no other large vessel occlusion or stenosis. Non dominant distal left vertebral artery appears to terminates in PICA. Dominant left A1 segment suspected. Electronically Signed   By: Genevie Ann M.D.   On: 05/09/2017 15:46   Mr Brain Wo Contrast  Addendum Date: 05/09/2017   ADDENDUM REPORT: 05/09/2017 16:09 ADDENDUM: I briefly discussed the salient MRI and intracranial MRA findings on this patient with RN Estill Dooms on 05/09/2017 at 1600 hours. Electronically Signed   By: Genevie Ann M.D.   On: 05/09/2017 16:09   Result Date: 05/09/2017 CLINICAL DATA:  82 year old female code stroke presentation with left side symptoms and suspected acute right MCA territory infarct on noncontrast head CT earlier today. EXAM: MRI HEAD WITHOUT CONTRAST TECHNIQUE: Multiplanar, multiecho pulse sequences of the brain and surrounding structures were obtained without intravenous contrast. COMPARISON:  Noncontrast head CT 1221 hours today. FINDINGS: Brain: Confluent restricted diffusion throughout the posterior right MCA territory. Superior peri-Rolandic cortex relatively spared. Right parietal lobe, posterior insula, and posterior temporal lobe a especially are affected. Involvement of the right MCA PCA watershed territory including the lateral aspect and some of the central right occipital lobe (series 1, image 83). Minimal cytotoxic edema associated. No associated acute hemorrhage. No intracranial mass effect at this time. Core infarct measures up to 6 cm longest dimension. No contralateral left hemisphere restricted diffusion, but there are 2 small foci of right cerebellar hemisphere diffusion restriction on series 1 images 68 and 71. Unrelated chronic microhemorrhage in the right deep cerebellar nuclei. No cerebellar edema or acute hemorrhage. Confluent bilateral cerebral white matter T2 and FLAIR hyperintensity. Chronic  lacunar infarcts in the bilateral deep gray matter nuclei. No cortical encephalomalacia or other chronic cerebral blood products. No midline shift, mass effect, evidence of mass lesion, ventriculomegaly, extra-axial collection or acute intracranial hemorrhage. Cervicomedullary junction and pituitary are within normal limits. Vascular: Major intracranial vascular flow voids are grossly preserved at the skullbase.  There is abnormal increased FLAIR signal in posterior right MCA branch(es). See MRA findings today reported separately. Skull and upper cervical spine: Normal bone marrow signal. Negative visible cervical spine. Sinuses/Orbits: Postoperative changes to both globes. Paranasal sinuses are stable and well pneumatized. Other: Mild mastoid effusions. Visible scalp and face soft tissues appear negative. IMPRESSION: 1. Moderate to large acute infarct in the posterior Right MCA territory, and affecting the right MCA/PCA watershed. Minimal edema with no hemorrhage or mass effect at this time. 2. Abnormal posterior Right MCA branches, see MRA today reported separately. 3. Two punctate acute infarcts also suspected in the right cerebellum. Therefore, consider a recent embolic event from the heart or proximal aorta. Electronically Signed: By: Genevie Ann M.D. On: 05/09/2017 15:40   Dg Chest Port 1 View  Result Date: 05/09/2017 CLINICAL DATA:  Cough. EXAM: PORTABLE CHEST 1 VIEW COMPARISON:  Radiographs of June 17, 2015. FINDINGS: Mild cardiomegaly is noted with mild central pulmonary vascular congestion. Atherosclerosis of thoracic aorta is noted. No pneumothorax or pleural effusion is noted. Bony thorax is unremarkable. No consolidative process is noted. IMPRESSION: Mild cardiomegaly with mild central pulmonary vascular congestion. Aortic atherosclerosis. Electronically Signed   By: Marijo Conception, M.D.   On: 05/09/2017 13:04   Ct Head Code Stroke Wo Contrast  Result Date: 05/09/2017 CLINICAL DATA:  Code  stroke. Left-sided weakness. Left facial droop. EXAM: CT HEAD WITHOUT CONTRAST TECHNIQUE: Contiguous axial images were obtained from the base of the skull through the vertex without intravenous contrast. COMPARISON:  02/13/2008 FINDINGS: Brain: There is a small area of decreased gray-white differentiation in the supraganglionic right frontoparietal region which may reflect an acute infarct. There is increased patchy low attenuation in the right lentiform nucleus compared to the prior CT. There is chronic hypoattenuation in the right internal capsule. A chronic lacunar infarct is again seen in the left lentiform nucleus, although there may be an additional new age indeterminate left lentiform nucleus lacunar infarct, and there also appear to be new age indeterminate lacunar infarcts in both thalami. Patchy to confluent hypodensities throughout the cerebral white matter bilaterally have mildly progressed and are compatible with moderately age advanced chronic small vessel ischemia. No acute intracranial hemorrhage, mass, midline shift, or extra-axial fluid collection is identified. Vascular: Calcified atherosclerosis at the skullbase. Asymmetric increased density of the right MCA at the M2 level. Skull: No fracture focal osseous lesion. Sinuses/Orbits: Visualized paranasal sinuses and mastoid air cells are clear. Bilateral cataract extraction. Other: None. ASPECTS [facility] Stroke Program Early CT Score) - Ganglionic level infarction (caudate, lentiform nuclei, internal capsule, insula, M1-M3 cortex): 6-7 (age indeterminate right lentiform nucleus infarct) - Supraganglionic infarction (M4-M6 cortex): 2 Total score (0-10 with 10 being normal): 8-9 IMPRESSION: 1. Suspected acute right MCA infarct involving right frontoparietal cortex and possibly right lentiform nucleus. No hemorrhage. 2. ASPECTS is 8-9. 3. Hyperdense right MCA at the M2 level which may reflect acute embolus. 4. Progressive chronic small vessel ischemia  including new age indeterminate lacunar infarcts in the deep gray nuclei bilaterally. These results were called by telephone at the time of interpretation on 05/09/2017 at 12:51 pm to Dr. Merrily Pew , who verbally acknowledged these results. Electronically Signed   By: Logan Bores M.D.   On: 05/09/2017 12:54    Medications:  Prior to Admission:  Medications Prior to Admission  Medication Sig Dispense Refill Last Dose  . bimatoprost (LUMIGAN) 0.01 % SOLN Place 1 drop into the left eye at bedtime.   05/09/2017  at Unknown time  . brimonidine (ALPHAGAN) 0.2 % ophthalmic solution Place 1 drop into both eyes 2 (two) times daily.   05/09/2017 at Unknown time  . Calcium Carbonate-Vitamin D (CALCIUM 500 + D) 500-125 MG-UNIT TABS Take 1 tablet by mouth daily.   05/09/2017 at Unknown time  . denosumab (PROLIA) 60 MG/ML SOLN injection Inject 60 mg into the skin every 6 (six) months. Administer in upper arm, thigh, or abdomen   05/08/2014  . dorzolamide-timolol (COSOPT) 22.3-6.8 MG/ML ophthalmic solution Place 1 drop into both eyes 2 (two) times daily.   05/09/2017 at Unknown time  . furosemide (LASIX) 40 MG tablet Take 40 mg by mouth daily.   05/09/2017 at Unknown time  . HYDROcodone-acetaminophen (NORCO/VICODIN) 5-325 MG per tablet Take 1 tablet by mouth every 6 (six) hours as needed (pain).   unknown  . losartan (COZAAR) 100 MG tablet Take 100 mg by mouth daily.   05/09/2017 at Unknown time  . Multiple Vitamin (MULTIVITAMIN WITH MINERALS) TABS Take 1 tablet by mouth daily.   05/09/2017 at Unknown time  . nabumetone (RELAFEN) 500 MG tablet Take 1,000 mg by mouth daily.   05/20/2014 at Unknown time  . omega-3 acid ethyl esters (LOVAZA) 1 G capsule Take 1 g by mouth daily.   05/09/2017 at Unknown time  . omeprazole (PRILOSEC) 20 MG capsule Take 20 mg by mouth daily.   05/09/2017 at Unknown time  . raloxifene (EVISTA) 60 MG tablet Take 60 mg by mouth daily.   05/09/2017 at Unknown time  . tiotropium  (SPIRIVA) 18 MCG inhalation capsule Place 18 mcg into inhaler and inhale daily.   05/08/2017 at Unknown time  . verapamil (VERELAN PM) 180 MG 24 hr capsule Take 360 mg by mouth at bedtime.   05/08/2017 at Unknown time  . HYDROcodone-acetaminophen (NORCO/VICODIN) 5-325 MG per tablet Take 2 tablets by mouth every 4 (four) hours as needed. 10 tablet 0   . naproxen (NAPROSYN) 500 MG tablet Take 1 tablet (500 mg total) by mouth 2 (two) times daily. 30 tablet 0    Scheduled: . aspirin  300 mg Rectal Daily   Or  . aspirin  325 mg Oral Daily  . atorvastatin  80 mg Oral q1800  . brimonidine  1 drop Both Eyes BID  . clopidogrel  75 mg Oral Q breakfast  . dorzolamide-timolol  1 drop Both Eyes BID  . enoxaparin (LOVENOX) injection  40 mg Subcutaneous Q24H  . latanoprost  1 drop Left Eye QHS  . multivitamin with minerals  1 tablet Oral Daily  . omega-3 acid ethyl esters  1 g Oral Daily  . pantoprazole  40 mg Oral Daily  . raloxifene  60 mg Oral Daily  . tiotropium  18 mcg Inhalation Daily  . verapamil  360 mg Oral QHS   Continuous: . sodium chloride 50 mL/hr at 05/10/17 1937   TKW:IOXBDZHGD, ondansetron **OR** ondansetron (ZOFRAN) IV  Assesment: She is admitted with an acute stroke.  She has weakness.  Occupational therapy has recommended skilled care facility and I think that is appropriate. Principal Problem:   Acute embolic stroke [facility]) Active Problems:   Stroke [facility])    Plan: Continue treatments.  Continue evaluations.    LOS: 1 day   Amariona Rathje L 05/10/2017, 8:35 AM

## 2017-05-10 NOTE — Evaluation (Signed)
Occupational Therapy Evaluation Patient Details Name: [name] M Avallone MRN: [id number] DOB: 1934-08-23 Today's Date: 05/10/2017    History of Present Illness [name]  is a 82 y.o. female, history of arthritis, hypertension and COPD was brought to the hospital after she was found on the floor.  She was last known to be normal about 16 hours back ?last evening when 1 of her neice spoke with her on the phone.  She did not answer her door this morning and her nephew had to break down the door to get in.  EMS found patient to have abnormal gait, left-sided weakness and left-sided facial droop.  Patient was alert during my conversation but had impaired vision on her left eye and left lateral gaze.  She informs that she was having frequent falls at home the past week about 3-4 falls yesterday itself.  Patient lives alone and is fairly independent and has not had similar symptoms in the past. CT and MRI positive for right CVA   Clinical Impression   Pt received supine in bed, agreeable to OT evaluation, somewhat HOH needing increased time to process instructions. During evaluation LUE strength is WFL at 4/5 and pt is able to use LUE during functional tasks with minimal difficulty. Pt does appear to display left hemianopsia with formal visual testing and with functional tasks-pt is running into walls and furniture and is unable to see items on left without turning head. Pt does use LUE volitionally for tasks and uses with RUE with good bilateral coordination. Sensation appears impaired with formal testing, and pt is unable to locate items such as walker handle by feel, requiring cuing to turn head. Recommend SNF on discharge to improve safety and independence with functional task completion and for learning compensatory strategies for visual field deficits, unless pt and family able to arrange 24/7 supervision then [facility]. Pt is not safe to be alone at home at this time.     Follow  Up Recommendations  SNF;Supervision/Assistance - 24 hour    Equipment Recommendations  None recommended by OT       Precautions / Restrictions Precautions Precautions: Fall Precaution Comments: multiple falls at home Restrictions Weight Bearing Restrictions: No      Mobility Bed Mobility Overal bed mobility: Modified Independent                Transfers Overall transfer level: Needs assistance Equipment used: Rolling walker (2 wheeled) Transfers: Sit to/from Stand Sit to Stand: Min guard                  ADL either performed or assessed with clinical judgement   ADL Overall ADL's : Needs assistance/impaired Eating/Feeding: Set up;Sitting Eating/Feeding Details (indicate cue type and reason): Pt requiring set up due to impaired vision  Grooming: Wash/dry face;Supervision/safety;Standing Grooming Details (indicate cue type and reason): Verbal cuing to turn head to left for locating soap and paper towels         Upper Body Dressing : Minimal assistance;Sitting Upper Body Dressing Details (indicate cue type and reason): assist for managing buttons and ties on gown Lower Body Dressing: Supervision/safety;Sit to/from stand Lower Body Dressing Details (indicate cue type and reason): Pt able to donn socks using LUE with no difficulty Toilet Transfer: Min guard;Ambulation;Regular Toilet;RW;Grab bars Toilet Transfer Details (indicate cue type and reason): Verbal cuing for locating grab bar on left and toilet paper on left Toileting- Clothing Manipulation and Hygiene: Min guard;Sit to/from stand Toileting - [employer] fast streamerClothing  Manipulation Details (indicate cue type and reason): Pt using BUE for donning undergarments     Functional mobility during ADLs: Min guard;Rolling walker General ADL Comments: Verbal cuing during functional mobility for navigating room with left visual field deficits     Vision Baseline Vision/History: Glaucoma Patient Visual Report: No change from  baseline Vision Assessment?: Yes Eye Alignment: Within Functional Limits Ocular Range of Motion: Within Functional Limits Alignment/Gaze Preference: Gaze right Tracking/Visual Pursuits: Decreased smoothness of horizontal tracking;Requires cues, head turns, or add eye shifts to track Saccades: Within functional limits Convergence: Within functional limits Visual Fields: Left homonymous hemianopsia            Pertinent Vitals/Pain Pain Assessment: No/denies pain     Hand Dominance Right   Extremity/Trunk Assessment Upper Extremity Assessment Upper Extremity Assessment: LUE deficits/detail LUE Deficits / Details: LUE strength 4/5 compared to 5/5 in RUE; sensation appears to be impaired to light touch   Lower Extremity Assessment Lower Extremity Assessment: Defer to PT evaluation   Cervical / Trunk Assessment Cervical / Trunk Assessment: Normal   Communication Communication Communication: HOH   Cognition Arousal/Alertness: Awake/alert Behavior During Therapy: WFL for tasks assessed/performed Overall Cognitive Status: Within Functional Limits for tasks assessed                                                Home Living Family/patient expects to be discharged to:: Private residence Living Arrangements: Alone Available Help at Discharge: Family;Available PRN/intermittently Type of Home: House Home Access: Stairs to enter Entergy CorporationEntrance Stairs-Number of Steps: 4 in the back, 8 in the front(pt uses the back entrance) Entrance Stairs-Rails: Right;Left;Can reach both Home Layout: One level     Bathroom Shower/Tub: Tub only   FirefighterBathroom Toilet: Standard     Home Equipment: Production assistant, radiohower seat;Cane - single point          Prior Functioning/Environment Level of Independence: Independent with assistive device(s)        Comments: Used SPC for functional mobility, independent with ADLs        OT Problem List: Decreased strength;Decreased activity  tolerance;Impaired balance (sitting and/or standing);Impaired vision/perception;Decreased safety awareness;Decreased knowledge of use of DME or AE       End of Session Equipment Utilized During Treatment: Gait belt;Rolling walker  Activity Tolerance: Patient tolerated treatment well Patient left: in chair;with call bell/phone within reach;Other (comment)(MD in room)  OT Visit Diagnosis: Hemiplegia and hemiparesis Hemiplegia - Right/Left: Left Hemiplegia - dominant/non-dominant: Non-Dominant Hemiplegia - caused by: Cerebral infarction                Time: 1914-7829: 0728-0815 OT Time Calculation (min): 47 min Charges:  OT General Charges $OT Visit: 1 Visit OT Evaluation $OT Eval Low Complexity: 1 Low    Ezra SitesLeslie Oksana Deberry, OTR/L  [phone] 05/10/2017, 8:25 AM

## 2017-05-10 NOTE — NC FL2 (Signed)
MEDICAID FL2 LEVEL OF CARE SCREENING TOOL     IDENTIFICATION  Patient Name: [name] Birthdate: May 18, 1935 Sex: female Admission Date (Current Location): 05/09/2017  [facility] [place]   Facility and Address:  [facility],  [address], [place] [id number]      Provider Number: [id number]  Attending Physician Name and Address:  Kari BaarsHawkins, Edward, MD  Relative Name and Phone Number:       Current Level of Care: Hospital Recommended Level of Care: Skilled Nursing Facility Prior Approval Number:    Date Approved/Denied:   PASRR Number: [id number]  Discharge Plan: SNF    Current Diagnoses: Patient Active Problem List   Diagnosis Date Noted  . Acute embolic stroke (HCC) 05/09/2017  . Stroke (HCC) 05/09/2017  . Falls frequently   . Essential hypertension   . Acute metabolic encephalopathy   . Glaucoma     Orientation RESPIRATION BLADDER Height & Weight     Self, Time, Situation, Place  Normal Continent Weight: 155 lb (70.3 kg) Height:  5\' 4"  (162.6 cm)  BEHAVIORAL SYMPTOMS/MOOD NEUROLOGICAL BOWEL NUTRITION STATUS      Continent Diet(heart healthy)  AMBULATORY STATUS COMMUNICATION OF NEEDS Skin   Extensive Assist Verbally Normal                       Personal Care Assistance Level of Assistance  Bathing, Feeding, Dressing Bathing Assistance: Limited assistance Feeding assistance: Limited assistance Dressing Assistance: Limited assistance     Functional Limitations Info  Sight, Hearing Sight Info: Impaired Hearing Info: Impaired      SPECIAL CARE FACTORS FREQUENCY  PT (By licensed PT), OT (By licensed OT)                    Contractures Contractures Info: Not present    Additional Factors Info  Code Status Code Status Info: full             Current Medications (05/10/2017):  This is the current hospital active medication list Current Facility-Administered Medications  Medication  Dose Route Frequency Provider Last Rate Last Dose  . 0.9 %  sodium chloride infusion   Intravenous Continuous Dhungel, Nishant, MD 50 mL/hr at 05/10/17 0649    . aspirin suppository 300 mg  300 mg Rectal Daily Dhungel, Nishant, MD       Or  . aspirin tablet 325 mg  325 mg Oral Daily Dhungel, Nishant, MD      . atorvastatin (LIPITOR) tablet 80 mg  80 mg Oral q1800 Dhungel, Nishant, MD      . bisacodyl (DULCOLAX) EC tablet 5 mg  5 mg Oral Daily PRN Dhungel, Nishant, MD      . brimonidine (ALPHAGAN) 0.2 % ophthalmic solution 1 drop  1 drop Both Eyes BID Dhungel, Nishant, MD   1 drop at 05/09/17 2041  . clopidogrel (PLAVIX) tablet 75 mg  75 mg Oral Q breakfast Beryle Beamsoonquah, Kofi, MD   75 mg at 05/10/17 0836  . dorzolamide-timolol (COSOPT) 22.3-6.8 MG/ML ophthalmic solution 1 drop  1 drop Both Eyes BID Dhungel, Nishant, MD   1 drop at 05/09/17 2042  . enoxaparin (LOVENOX) injection 40 mg  40 mg Subcutaneous Q24H Dhungel, Nishant, MD   40 mg at 05/09/17 1754  . latanoprost (XALATAN) 0.005 % ophthalmic solution 1 drop  1 drop Left Eye QHS Dhungel, Nishant, MD   1 drop at 05/09/17 2041  . multivitamin with minerals tablet 1  tablet  1 tablet Oral Daily Dhungel, Nishant, MD      . omega-3 acid ethyl esters (LOVAZA) capsule 1 g  1 g Oral Daily Dhungel, Nishant, MD      . ondansetron (ZOFRAN) tablet 4 mg  4 mg Oral Q6H PRN Dhungel, Nishant, MD       Or  . ondansetron (ZOFRAN) injection 4 mg  4 mg Intravenous Q6H PRN Dhungel, Nishant, MD      . pantoprazole (PROTONIX) EC tablet 40 mg  40 mg Oral Daily Dhungel, Nishant, MD      . raloxifene (EVISTA) tablet 60 mg  60 mg Oral Daily Dhungel, Nishant, MD      . tiotropium (SPIRIVA) inhalation capsule 18 mcg  18 mcg Inhalation Daily Dhungel, Nishant, MD      . verapamil (CALAN-SR) CR tablet 360 mg  360 mg Oral QHS Dhungel, Nishant, MD   360 mg at 05/09/17 2041     Discharge Medications: Please see discharge summary for a list of discharge medications.  Relevant  Imaging Results:  Relevant Lab Results:   Additional Information SSN: [id number]  Elliot GaultKathleen Jenilee Franey, LCSW

## 2017-05-10 NOTE — Progress Notes (Signed)
*  PRELIMINARY RESULTS* Echocardiogram 2D Echocardiogram has been performed with a Saline Bubble Study.  Stacey DrainWhite, Fusako Tanabe J 05/10/2017, 3:05 PM

## 2017-05-10 NOTE — Plan of Care (Signed)
  Acute Rehab PT Goals(only PT should resolve) Patient Will Transfer Sit To/From Stand 05/10/2017 1337 - Progressing by Ocie BobWatkins, Kristeena Meineke, PT Flowsheets Taken 05/10/2017 1337  Patient will transfer sit to/from stand with supervision Pt Will Transfer Bed To Chair/Chair To Bed 05/10/2017 1337 - Progressing by Ocie BobWatkins, Davit Vassar, PT Flowsheets Taken 05/10/2017 1337  Pt will Transfer Bed to Chair/Chair to Bed with supervision Pt Will Ambulate 05/10/2017 1337 - Progressing by Ocie BobWatkins, Arrianna Catala, PT Flowsheets Taken 05/10/2017 1337  Pt will Ambulate 100 feet;with cane;with supervision  1:38 PM, 05/10/17 Ocie BobJames Cybele Maule, MPT Physical Therapist with [facility] [address] [phone] office [phone] mobile phone

## 2017-05-10 NOTE — Evaluation (Signed)
Speech Language Pathology Evaluation Patient Details Name: [name] MRN: [id number] DOB: [birth year] Today's Date: 05/10/2017 Time: [id number] SLP Time Calculation (min) (ACUTE ONLY): 28 min  Problem List:  Patient Active Problem List   Diagnosis Date Noted  . Acute embolic stroke (HCC) 05/09/2017  . Stroke (HCC) 05/09/2017  . Falls frequently   . Essential hypertension   . Acute metabolic encephalopathy   . Glaucoma    Past Medical History:  Past Medical History:  Diagnosis Date  . Arthritis   . COPD (chronic obstructive pulmonary disease) (HCC)   . Hypertension    Past Surgical History:  Past Surgical History:  Procedure Laterality Date  . EYE SURGERY     HPI:  [name] is a 82 y.o. female, history of arthritis, hypertension and COPD was brought to the hospital after she was found on the floor. She was last known to be normal about 16 hours back ?last evening when 1 of her neice spoke with her on the phone. She did not answer her door this morning and her nephew had to break down the door to get in. EMS found patient to have abnormal gait, left-sided weakness and left-sided facial droop. Patient was alert during my conversation but had impaired vision on her left eye and left lateral gaze. She informs that she was having frequent falls at home the past week about 3-4 falls yesterday itself. Patient lives alone and is fairly independent and has not had similar symptoms in the past.Moderate to large acute infarct in the posterior Right MCA territory, and affecting the right MCA/PCA watershed. Minimal edema with no hemorrhage or mass effect at this time. Abnormal posterior Right MCA branches, see MRA today reported separately.Two punctate acute infarcts also suspected in the right cerebellum. Therefore, consider a recent embolic event from the heart or proximal aorta. Pt passed RN swallow screen. SLE ordered.   Assessment / Plan / Recommendation Clinical  Impression  Upon SLP arrival, Pt was sitting upright in bed and had spilled drinks and food on her left side. Pt also could not find her fork and knife and SLP discovered them under her blankets on the left side of her body. Pt is hard of hearing and has a self report of decreased vision. She was able to read sentences and the clock when provided with visual cues. Pt able to express moderately complex wants and needs verbally and demonstrates adequate memory and problem solving skills. Pt's primary deficit at this time is related to visual inattention and possible visual field cut to the left side, which negatively impacts communication with those on her left side (niece visiting) and safety in her environment. Pt will need supervision during meals due to left visual deficits. Recommend SLP services at next level of care.    SLP Assessment  SLP Recommendation/Assessment: All further Speech Lanaguage Pathology  needs can be addressed in the next venue of care SLP Visit Diagnosis: Cognitive communication deficit (R41.841)    Follow Up Recommendations  Skilled Nursing facility    Frequency and Duration           SLP Evaluation Cognition  Overall Cognitive Status: Within Functional Limits for tasks assessed Arousal/Alertness: Awake/alert Orientation Level: Oriented X4 Memory: Appears intact Awareness: Impaired Awareness Impairment: Emergent impairment Problem Solving: Appears intact Executive Function: (decreased due to visual perceptual deficits) Safety/Judgment: Appears intact       Comprehension  Auditory Comprehension Overall Auditory Comprehension: Appears within functional limits for tasks assessed Yes/No Questions: Within  Functional Limits Commands: Within Functional Limits Conversation: Simple Interfering Components: Attention;Visual impairments Visual Recognition/Discrimination Discrimination: Exceptions to [facility] Reading Comprehension Reading Status: (able to read short  sentences with visual cue)    Expression Expression Primary Mode of Expression: Verbal Verbal Expression Overall Verbal Expression: Appears within functional limits for tasks assessed Initiation: No impairment Automatic Speech: Name;Social Response;Counting Level of Generative/Spontaneous Verbalization: Conversation Repetition: No impairment Naming: Impairment Responsive: 76-100% accurate Confrontation: Within functional limits Convergent: 75-100% accurate Divergent: 75-100% accurate Pragmatics: No impairment Non-Verbal Means of Communication: Not applicable Written Expression Dominant Hand: Right Written Expression: Exceptions to [facility] Hospital[facility]FL Self Formulation Ability: (impaired clock drawing)   Oral / Motor  Oral Motor/Sensory Function Overall Oral Motor/Sensory Function: Within functional limits Motor Speech Overall Motor Speech: Appears within functional limits for tasks assessed Respiration: Within functional limits Phonation: Normal Resonance: Within functional limits Articulation: Within functional limitis Intelligibility: Intelligible Motor Planning: Witnin functional limits Motor Speech Errors: Not applicable Interfering Components: Premorbid status   Thank you,  Havery MorosDabney Porter, CCC-SLP [phone]                     PORTER,DABNEY 05/10/2017, 2:12 PM

## 2017-05-10 NOTE — Clinical Social Work Note (Signed)
Clinical Social Work Assessment  Patient Details  Name: [name] MRN: [id number] Date of Birth: [birth year]  Date of referral:  05/10/17               Reason for consult:  Facility Placement                Permission sought to share information with:    Permission granted to share information::     Name::        Agency::     Relationship::     Contact Information:     Housing/Transportation Living arrangements for the past 2 months:  Single Family Home Source of Information:  Patient Patient Interpreter Needed:  None Criminal Activity/Legal Involvement Pertinent to Current Situation/Hospitalization:    Significant Relationships:  Pets, Other Family Members Lives with:  Self, Pets Do you feel safe going back to the place where you live?  Yes Need for family participation in patient care:  Yes (Comment)  Care giving concerns: Pt lives alone. Her nieces check on her.   Social Worker assessment / plan: Pt is a 81 year old female admitted with CVA. Recommendation is for SNF rehab. Met with pt this AM. Pt is alert and oriented x4. She is pleasant and easily engaged in conversation. Pt is hard of hearing and has some limitations with vision due to cataracts. Pt reports that she lives alone in her one level home with 5 step entry. Pt uses a cane at home but states she would prefer to have a walker. Pt states that her niece takes her shopping. [facility] takes her to appointments. Pt is independent in ADL's at baseline. Pt has a dog that she adores at home as well. Discussed SNF rehab with pt who is agreeable and she requests referral to SNF facilities in [place]. Referrals will be sent. SW will follow to assist with dc planning.  Employment status:  Retired Nurse, adult, Medicaid In [place] PT Recommendations:  [place] / Referral to community resources:     Patient/Family's Response to care: Pt accepting of  care.  Patient/Family's Understanding of and Emotional Response to Diagnosis, Current Treatment, and Prognosis: Pt has good understanding and is comfortable with the plan.  Emotional Assessment Appearance:  Appears stated age Attitude/Demeanor/Rapport:    Affect (typically observed):  Accepting, Calm, Happy, Pleasant Orientation:  Oriented to Self, Oriented to Place, Oriented to  Time, Oriented to Situation Alcohol / Substance use:    Psych involvement (Current and /or in the community):     Discharge Needs  Concerns to be addressed:  Discharge Planning Concerns Readmission within the last 30 days:  No Current discharge risk:  None Barriers to Discharge:  No Barriers Identified   Shade Flood, LCSW 05/10/2017, 10:47 AM

## 2017-05-11 NOTE — Progress Notes (Signed)
Subjective: She says she feels better.  No new complaints.  Recommendation for skilled care facility is noted.  Objective: Vital signs in last 24 hours: Temp:  [98.4 F (36.9 C)-98.8 F (37.1 C)] 98.4 F (36.9 C) (11/22 0750) Pulse Rate:  [58-61] 59 (11/21 2350) Resp:  [16-18] 18 (11/22 0750) BP: (124-172)/(48-69) 169/56 (11/22 0750) SpO2:  [94 %-100 %] 98 % (11/22 0757) Weight change:  Last BM Date: 05/11/17  Intake/Output from previous day: 11/21 0701 - 11/22 0700 In: -  Out: 1 [Stool:1]  PHYSICAL EXAM General appearance: alert, cooperative and no distress Resp: clear to auscultation bilaterally Cardio: regular rate and rhythm, S1, S2 normal, no murmur, click, rub or gallop GI: soft, non-tender; bowel sounds normal; no masses,  no organomegaly Extremities: extremities normal, atraumatic, no cyanosis or edema Still weakness of the left side and still seems to have some visual disturbance on the left.  Lab Results:  Results for orders placed or performed during the hospital encounter of 05/09/17 (from the past 48 hour(s))  Urinalysis, Routine w reflex microscopic     Status: Abnormal   Collection Time: 05/09/17 11:59 AM  Result Value Ref Range   Color, Urine STRAW (A) YELLOW   APPearance CLEAR CLEAR   Specific Gravity, Urine 1.004 (L) 1.005 - 1.030   pH 6.0 5.0 - 8.0   Glucose, UA NEGATIVE NEGATIVE mg/dL   Hgb urine dipstick NEGATIVE NEGATIVE   Bilirubin Urine NEGATIVE NEGATIVE   Ketones, ur NEGATIVE NEGATIVE mg/dL   Protein, ur NEGATIVE NEGATIVE mg/dL   Nitrite NEGATIVE NEGATIVE   Leukocytes, UA NEGATIVE NEGATIVE  Comprehensive metabolic panel     Status: Abnormal   Collection Time: 05/09/17 11:59 AM  Result Value Ref Range   Sodium 141 135 - 145 mmol/L   Potassium 4.0 3.5 - 5.1 mmol/L   Chloride 113 (H) 101 - 111 mmol/L   CO2 22 22 - 32 mmol/L   Glucose, Bld 101 (H) 65 - 99 mg/dL   BUN 12 6 - 20 mg/dL   Creatinine, Ser 1.09 (H) 0.44 - 1.00 mg/dL   Calcium 9.0  8.9 - 10.3 mg/dL   Total Protein 7.5 6.5 - 8.1 g/dL   Albumin 3.5 3.5 - 5.0 g/dL   AST 24 15 - 41 U/L   ALT 9 (L) 14 - 54 U/L   Alkaline Phosphatase 31 (L) 38 - 126 U/L   Total Bilirubin 0.5 0.3 - 1.2 mg/dL   GFR calc non Af Amer 47 (L) >60 mL/min   GFR calc Af Amer 54 (L) >60 mL/min    Comment: (NOTE) The eGFR has been calculated using the CKD EPI equation. This calculation has not been validated in all clinical situations. eGFR's persistently <60 mL/min signify possible Chronic Kidney Disease.    Anion gap 6 5 - 15  Ethanol     Status: None   Collection Time: 05/09/17 11:59 AM  Result Value Ref Range   Alcohol, Ethyl (B) <10 <10 mg/dL    Comment:        LOWEST DETECTABLE LIMIT FOR SERUM ALCOHOL IS 10 mg/dL FOR MEDICAL PURPOSES ONLY   Protime-INR     Status: None   Collection Time: 05/09/17 11:59 AM  Result Value Ref Range   Prothrombin Time 13.0 11.4 - 15.2 seconds   INR 0.99   APTT     Status: None   Collection Time: 05/09/17 11:59 AM  Result Value Ref Range   aPTT 24 24 - 36 seconds  CBC with Differential     Status: None   Collection Time: 05/09/17 11:59 AM  Result Value Ref Range   WBC DUPLICATE CBC ALREADY DONE 4.0 - 46.5 K/uL   RBC DUPLICATE CBC ALREADY DONE 3.87 - 5.11 MIL/uL   Hemoglobin DUPLICATE CBC ALREADY DONE 12.0 - 03.5 g/dL   HCT DUPLICATE CBC ALREADY DONE 36.0 - 46.5 %   MCV DUPLICATE CBC ALREADY DONE 78.0 - 681.2 fL   MCH DUPLICATE CBC ALREADY DONE 26.0 - 75.1 pg   MCHC DUPLICATE CBC ALREADY DONE 30.0 - 70.0 g/dL   RDW DUPLICATE CBC ALREADY DONE 11.5 - 15.5 %   Platelets DUPLICATE CBC ALREADY DONE 150 - 400 K/uL   Neutrophils Relative % 61 %   Neutro Abs 5.0 1.7 - 7.7 K/uL   Lymphocytes Relative 27 %   Lymphs Abs 2.2 0.7 - 4.0 K/uL   Monocytes Relative 9 %   Monocytes Absolute 0.7 0.1 - 1.0 K/uL   Eosinophils Relative 3 %   Eosinophils Absolute 0.3 0.0 - 0.7 K/uL   Basophils Relative 0 %   Basophils Absolute 0.0 0.0 - 0.1 K/uL  CBC      Status: Abnormal   Collection Time: 05/09/17 11:59 AM  Result Value Ref Range   WBC 8.3 4.0 - 10.5 K/uL   RBC 4.32 3.87 - 5.11 MIL/uL   Hemoglobin 11.4 (L) 12.0 - 15.0 g/dL   HCT 36.8 36.0 - 46.0 %   MCV 85.2 78.0 - 100.0 fL   MCH 26.4 26.0 - 34.0 pg   MCHC 31.0 30.0 - 36.0 g/dL   RDW 15.2 11.5 - 15.5 %   Platelets 203 150 - 400 K/uL  Urine rapid drug screen (hosp performed)     Status: None   Collection Time: 05/09/17 12:10 PM  Result Value Ref Range   Opiates NONE DETECTED NONE DETECTED   Cocaine NONE DETECTED NONE DETECTED   Benzodiazepines NONE DETECTED NONE DETECTED   Amphetamines NONE DETECTED NONE DETECTED   Tetrahydrocannabinol NONE DETECTED NONE DETECTED   Barbiturates NONE DETECTED NONE DETECTED    Comment:        DRUG SCREEN FOR MEDICAL PURPOSES ONLY.  IF CONFIRMATION IS NEEDED FOR ANY PURPOSE, NOTIFY LAB WITHIN 5 DAYS.        LOWEST DETECTABLE LIMITS FOR URINE DRUG SCREEN Drug Class       Cutoff (ng/mL) Amphetamine      1000 Barbiturate      200 Benzodiazepine   174 Tricyclics       944 Opiates          300 Cocaine          300 THC              50   I-stat troponin, ED     Status: None   Collection Time: 05/09/17 12:17 PM  Result Value Ref Range   Troponin i, poc 0.01 0.00 - 0.08 ng/mL   Comment 3            Comment: Due to the release kinetics of cTnI, a negative result within the first hours of the onset of symptoms does not rule out myocardial infarction with certainty. If myocardial infarction is still suspected, repeat the test at appropriate intervals.   I-Stat Chem 8, ED     Status: Abnormal   Collection Time: 05/09/17 12:19 PM  Result Value Ref Range   Sodium 143 135 - 145 mmol/L   Potassium 4.2 3.5 -  5.1 mmol/L   Chloride 115 (H) 101 - 111 mmol/L   BUN 11 6 - 20 mg/dL   Creatinine, Ser 1.00 0.44 - 1.00 mg/dL   Glucose, Bld 98 65 - 99 mg/dL   Calcium, Ion 1.13 (L) 1.15 - 1.40 mmol/L   TCO2 19 (L) 22 - 32 mmol/L   Hemoglobin 12.2 12.0 -  15.0 g/dL   HCT 36.0 36.0 - 46.0 %  Hemoglobin A1c     Status: Abnormal   Collection Time: 05/10/17  5:45 AM  Result Value Ref Range   Hgb A1c MFr Bld 6.0 (H) 4.8 - 5.6 %    Comment: (NOTE) Pre diabetes:          5.7%-6.4% Diabetes:              >6.4% Glycemic control for   <7.0% adults with diabetes    Mean Plasma Glucose 125.5 mg/dL    Comment: Performed at [facility], [place] [address], [place], [place] [address]  Lipid panel     Status: None   Collection Time: 05/10/17  5:49 AM  Result Value Ref Range   Cholesterol 159 0 - 200 mg/dL   Triglycerides 109 <150 mg/dL   HDL 55 >40 mg/dL   Total CHOL/HDL Ratio 2.9 RATIO   VLDL 22 0 - 40 mg/dL   LDL Cholesterol 82 0 - 99 mg/dL    Comment:        Total Cholesterol/HDL:CHD Risk Coronary Heart Disease Risk Table                     Men   Women  1/2 Average Risk   3.4   3.3  Average Risk       5.0   4.4  2 X Average Risk   9.6   7.1  3 X Average Risk  23.4   11.0        Use the calculated Patient Ratio above and the CHD Risk Table to determine the patient's CHD Risk.        ATP III CLASSIFICATION (LDL):  <100     mg/dL   Optimal  100-129  mg/dL   Near or Above                    Optimal  130-159  mg/dL   Borderline  160-189  mg/dL   High  >190     mg/dL   Very High   Basic metabolic panel     Status: Abnormal   Collection Time: 05/10/17  5:49 AM  Result Value Ref Range   Sodium 140 135 - 145 mmol/L   Potassium 3.8 3.5 - 5.1 mmol/L   Chloride 112 (H) 101 - 111 mmol/L   CO2 20 (L) 22 - 32 mmol/L   Glucose, Bld 91 65 - 99 mg/dL   BUN 12 6 - 20 mg/dL   Creatinine, Ser 0.91 0.44 - 1.00 mg/dL   Calcium 8.6 (L) 8.9 - 10.3 mg/dL   GFR calc non Af Amer 58 (L) >60 mL/min   GFR calc Af Amer >60 >60 mL/min    Comment: (NOTE) The eGFR has been calculated using the CKD EPI equation. This calculation has not been validated in all clinical situations. eGFR's persistently <60 mL/min signify possible Chronic Kidney Disease.     Anion gap 8 5 - 15  CBC     Status: Abnormal   Collection Time: 05/10/17  5:49 AM  Result  Value Ref Range   WBC 7.0 4.0 - 10.5 K/uL   RBC 4.03 3.87 - 5.11 MIL/uL   Hemoglobin 10.4 (L) 12.0 - 15.0 g/dL   HCT 34.3 (L) 36.0 - 46.0 %   MCV 85.1 78.0 - 100.0 fL   MCH 25.8 (L) 26.0 - 34.0 pg   MCHC 30.3 30.0 - 36.0 g/dL   RDW 15.3 11.5 - 15.5 %   Platelets 184 150 - 400 K/uL    [facility] Recent Labs    05/09/17 1219  TCO2 19*   CULTURES No results found for this or any previous visit (from the past 240 hour(s)). Studies/Results: Mr Virgel Paling CW Contrast  Result Date: 05/09/2017 CLINICAL DATA:  82 year old female code stroke presentation with left side symptoms and suspected acute right MCA territory infarct on noncontrast head CT earlier today. EXAM: MRA HEAD WITHOUT CONTRAST TECHNIQUE: Angiographic images of the Circle of Willis were obtained using MRA technique without intravenous contrast. COMPARISON:  Brain MRI today reported separately. FINDINGS: Study is intermittently degraded by motion artifact despite repeated imaging attempts. Antegrade flow in the posterior circulation with dominant appearing distal right vertebral artery. The distal left vertebral artery appears to terminated in PICA. Patent vertebrobasilar junction and basilar artery without stenosis. Fetal type bilateral PCA origins, more so the right. No proximal PCA occlusion or stenosis but there is distal right PCA multifocal decreased flow signal which is possibly genuine in light of the MRI findings today reported separately. Distal left PCA flow appears within normal limits. Antegrade flow in both ICA siphons. Carotid termini and bilateral posterior communicating artery origins are patent. MCA and left ACA origins are patent. The left A1 appears dominant and the right diminutive. Anterior communicating artery and proximal ACA branches are patent. Left MCA M1 segment, left MCA bifurcation and proximal left MCA branches are  patent. The right MCA M1 segment is patent, but there is asymmetric decreased flow at the right MCA trifurcation and decreased flow signal in the right MCA M2 branches, especially the posterior division. IMPRESSION: 1. Positive for occlusion of the Right MCA posterior right M2 branch. Asymmetric decreased flow signal in the other right MCA branches raising the possibility of thrombus or stenosis at the Right MCA bifurcation. 2. Positive also for fetal type PCA origins with distal Right PCA stenosis or occlusion. Note that this is concordant with the Right MCA/PCA watershed area infarct involvement on today MRI. 3. Motion degraded with no other large vessel occlusion or stenosis. Non dominant distal left vertebral artery appears to terminates in PICA. Dominant left A1 segment suspected. Electronically Signed   By: Genevie Ann M.D.   On: 05/09/2017 15:46   Mr Brain Wo Contrast  Addendum Date: 05/09/2017   ADDENDUM REPORT: 05/09/2017 16:09 ADDENDUM: I briefly discussed the salient MRI and intracranial MRA findings on this patient with RN Estill Dooms on 05/09/2017 at 1600 hours. Electronically Signed   By: Genevie Ann M.D.   On: 05/09/2017 16:09   Result Date: 05/09/2017 CLINICAL DATA:  82 year old female code stroke presentation with left side symptoms and suspected acute right MCA territory infarct on noncontrast head CT earlier today. EXAM: MRI HEAD WITHOUT CONTRAST TECHNIQUE: Multiplanar, multiecho pulse sequences of the brain and surrounding structures were obtained without intravenous contrast. COMPARISON:  Noncontrast head CT 1221 hours today. FINDINGS: Brain: Confluent restricted diffusion throughout the posterior right MCA territory. Superior peri-Rolandic cortex relatively spared. Right parietal lobe, posterior insula, and posterior temporal lobe a especially are affected. Involvement of the right  MCA PCA watershed territory including the lateral aspect and some of the central right occipital lobe (series 1, image  83). Minimal cytotoxic edema associated. No associated acute hemorrhage. No intracranial mass effect at this time. Core infarct measures up to 6 cm longest dimension. No contralateral left hemisphere restricted diffusion, but there are 2 small foci of right cerebellar hemisphere diffusion restriction on series 1 images 68 and 71. Unrelated chronic microhemorrhage in the right deep cerebellar nuclei. No cerebellar edema or acute hemorrhage. Confluent bilateral cerebral white matter T2 and FLAIR hyperintensity. Chronic lacunar infarcts in the bilateral deep gray matter nuclei. No cortical encephalomalacia or other chronic cerebral blood products. No midline shift, mass effect, evidence of mass lesion, ventriculomegaly, extra-axial collection or acute intracranial hemorrhage. Cervicomedullary junction and pituitary are within normal limits. Vascular: Major intracranial vascular flow voids are grossly preserved at the skullbase. There is abnormal increased FLAIR signal in posterior right MCA branch(es). See MRA findings today reported separately. Skull and upper cervical spine: Normal bone marrow signal. Negative visible cervical spine. Sinuses/Orbits: Postoperative changes to both globes. Paranasal sinuses are stable and well pneumatized. Other: Mild mastoid effusions. Visible scalp and face soft tissues appear negative. IMPRESSION: 1. Moderate to large acute infarct in the posterior Right MCA territory, and affecting the right MCA/PCA watershed. Minimal edema with no hemorrhage or mass effect at this time. 2. Abnormal posterior Right MCA branches, see MRA today reported separately. 3. Two punctate acute infarcts also suspected in the right cerebellum. Therefore, consider a recent embolic event from the heart or proximal aorta. Electronically Signed: By: Genevie Ann M.D. On: 05/09/2017 15:40   US Carotid Bilateral (at [facility] Only)  Result Date: 05/10/2017 CLINICAL DATA:  82 year old female with a history of CVA.  Cardiovascular risk factors include hypertension, stroke/TIA, hyperlipidemia, tobacco use EXAM: BILATERAL CAROTID DUPLEX ULTRASOUND TECHNIQUE: Pearline Cables scale imaging, color Doppler and duplex ultrasound were performed of bilateral carotid and vertebral arteries in the neck. COMPARISON:  No prior duplex FINDINGS: Criteria: Quantification of carotid stenosis is based on velocity parameters that correlate the residual internal carotid diameter with NASCET-based stenosis levels, using the diameter of the distal internal carotid lumen as the denominator for stenosis measurement. The following velocity measurements were obtained: RIGHT ICA:  Systolic 39 cm/sec, Diastolic 5 cm/sec CCA:  95 cm/sec SYSTOLIC ICA/CCA RATIO:  0.4 ECA:  80 cm/sec LEFT ICA:  Systolic 720 cm/sec, Diastolic 24 cm/sec CCA:  947 cm/sec SYSTOLIC ICA/CCA RATIO:  1.0 ECA:  179 cm/sec Right Brachial SBP: Not acquired Left Brachial SBP: Not acquired RIGHT CAROTID ARTERY: No significant calcified disease of the right common carotid artery. Intermediate waveform maintained. Heterogeneous plaque without significant calcifications at the right carotid bifurcation. Low resistance waveform of the right ICA. No significant tortuosity. RIGHT VERTEBRAL ARTERY: Antegrade flow with low resistance waveform. LEFT CAROTID ARTERY: No significant calcified disease of the left common carotid artery. Intermediate waveform maintained. Heterogeneous plaque at the left carotid bifurcation without significant calcifications. Low resistance waveform of the left ICA. LEFT VERTEBRAL ARTERY:  Antegrade flow with low resistance waveform. IMPRESSION: Color duplex indicates minimal heterogeneous plaque, with no hemodynamically significant stenosis by duplex criteria in the extracranial cerebrovascular circulation. Signed, Dulcy Fanny. Earleen Newport, DO [facility] Electronically Signed   By: Corrie Mckusick D.O.   On: 05/10/2017 09:58   Dg  Chest Port 1 View  Result Date: 05/09/2017 CLINICAL DATA:  Cough. EXAM: PORTABLE CHEST 1 VIEW COMPARISON:  Radiographs of June 17, 2015. FINDINGS: Mild cardiomegaly is noted with mild central pulmonary vascular congestion. Atherosclerosis of thoracic aorta is noted. No pneumothorax or pleural effusion is noted. Bony thorax is unremarkable. No consolidative process is noted. IMPRESSION: Mild cardiomegaly with mild central pulmonary vascular congestion. Aortic atherosclerosis. Electronically Signed   By: Marijo Conception, M.D.   On: 05/09/2017 13:04   Ct Head Code Stroke Wo Contrast  Result Date: 05/09/2017 CLINICAL DATA:  Code stroke. Left-sided weakness. Left facial droop. EXAM: CT HEAD WITHOUT CONTRAST TECHNIQUE: Contiguous axial images were obtained from the base of the skull through the vertex without intravenous contrast. COMPARISON:  02/13/2008 FINDINGS: Brain: There is a small area of decreased gray-white differentiation in the supraganglionic right frontoparietal region which may reflect an acute infarct. There is increased patchy low attenuation in the right lentiform nucleus compared to the prior CT. There is chronic hypoattenuation in the right internal capsule. A chronic lacunar infarct is again seen in the left lentiform nucleus, although there may be an additional new age indeterminate left lentiform nucleus lacunar infarct, and there also appear to be new age indeterminate lacunar infarcts in both thalami. Patchy to confluent hypodensities throughout the cerebral white matter bilaterally have mildly progressed and are compatible with moderately age advanced chronic small vessel ischemia. No acute intracranial hemorrhage, mass, midline shift, or extra-axial fluid collection is identified. Vascular: Calcified atherosclerosis at the skullbase. Asymmetric increased density of the right MCA at the M2 level. Skull: No fracture focal osseous lesion. Sinuses/Orbits: Visualized paranasal sinuses and  mastoid air cells are clear. Bilateral cataract extraction. Other: None. ASPECTS [facility] Stroke Program Early CT Score) - Ganglionic level infarction (caudate, lentiform nuclei, internal capsule, insula, M1-M3 cortex): 6-7 (age indeterminate right lentiform nucleus infarct) - Supraganglionic infarction (M4-M6 cortex): 2 Total score (0-10 with 10 being normal): 8-9 IMPRESSION: 1. Suspected acute right MCA infarct involving right frontoparietal cortex and possibly right lentiform nucleus. No hemorrhage. 2. ASPECTS is 8-9. 3. Hyperdense right MCA at the M2 level which may reflect acute embolus. 4. Progressive chronic small vessel ischemia including new age indeterminate lacunar infarcts in the deep gray nuclei bilaterally. These results were called by telephone at the time of interpretation on 05/09/2017 at 12:51 pm to Dr. Merrily Pew , who verbally acknowledged these results. Electronically Signed   By: Logan Bores M.D.   On: 05/09/2017 12:54    Medications:  Prior to Admission:  Medications Prior to Admission  Medication Sig Dispense Refill Last Dose  . bimatoprost (LUMIGAN) 0.01 % SOLN Place 1 drop into the left eye at bedtime.   05/09/2017 at Unknown time  . brimonidine (ALPHAGAN) 0.2 % ophthalmic solution Place 1 drop into both eyes 2 (two) times daily.   05/09/2017 at Unknown time  . Calcium Carbonate-Vitamin D (CALCIUM 500 + D) 500-125 MG-UNIT TABS Take 1 tablet by mouth daily.   05/09/2017 at Unknown time  . denosumab (PROLIA) 60 MG/ML SOLN injection Inject 60 mg into the skin every 6 (six) months. Administer in upper arm, thigh, or abdomen   05/08/2014  . dorzolamide-timolol (COSOPT) 22.3-6.8 MG/ML ophthalmic solution Place 1 drop into both eyes 2 (two) times daily.   05/09/2017 at Unknown time  . furosemide (LASIX) 40 MG tablet Take 40 mg by mouth daily.   05/09/2017 at Unknown time  . HYDROcodone-acetaminophen (NORCO/VICODIN) 5-325 MG per tablet Take 1 tablet by mouth every 6 (six) hours as  needed (pain).   unknown  . losartan (COZAAR) 100 MG tablet Take 100 mg  by mouth daily.   05/09/2017 at Unknown time  . Multiple Vitamin (MULTIVITAMIN WITH MINERALS) TABS Take 1 tablet by mouth daily.   05/09/2017 at Unknown time  . nabumetone (RELAFEN) 500 MG tablet Take 1,000 mg by mouth daily.   05/20/2014 at Unknown time  . omega-3 acid ethyl esters (LOVAZA) 1 G capsule Take 1 g by mouth daily.   05/09/2017 at Unknown time  . omeprazole (PRILOSEC) 20 MG capsule Take 20 mg by mouth daily.   05/09/2017 at Unknown time  . raloxifene (EVISTA) 60 MG tablet Take 60 mg by mouth daily.   05/09/2017 at Unknown time  . tiotropium (SPIRIVA) 18 MCG inhalation capsule Place 18 mcg into inhaler and inhale daily.   05/08/2017 at Unknown time  . verapamil (VERELAN PM) 180 MG 24 hr capsule Take 360 mg by mouth at bedtime.   05/08/2017 at Unknown time  . HYDROcodone-acetaminophen (NORCO/VICODIN) 5-325 MG per tablet Take 2 tablets by mouth every 4 (four) hours as needed. 10 tablet 0   . naproxen (NAPROSYN) 500 MG tablet Take 1 tablet (500 mg total) by mouth 2 (two) times daily. 30 tablet 0    Scheduled: . aspirin  300 mg Rectal Daily   Or  . aspirin  325 mg Oral Daily  . atorvastatin  80 mg Oral q1800  . brimonidine  1 drop Both Eyes BID  . clopidogrel  75 mg Oral Q breakfast  . dorzolamide-timolol  1 drop Both Eyes BID  . enoxaparin (LOVENOX) injection  40 mg Subcutaneous Q24H  . latanoprost  1 drop Left Eye QHS  . multivitamin with minerals  1 tablet Oral Daily  . omega-3 acid ethyl esters  1 g Oral Daily  . pantoprazole  40 mg Oral Daily  . raloxifene  60 mg Oral Daily  . tiotropium  18 mcg Inhalation Daily  . verapamil  360 mg Oral QHS   Continuous: . sodium chloride 50 mL/hr at 05/11/17 0219   HXT:AVWPVXYIA, ondansetron **OR** ondansetron (ZOFRAN) IV  Assesment: She was admitted with an acute stroke.  She still has visual defects and weakness of the left side and I think she is going to need  to be sent to rehab.  She understands my recommendation but is reluctant. Principal Problem:   Acute embolic stroke [facility]) Active Problems:   Essential hypertension   Glaucoma   Stroke [facility])    Plan: Continue treatments.    LOS: 2 days   Tila Millirons L 05/11/2017, 8:42 AM

## 2017-05-12 DIAGNOSIS — R414 Neurologic neglect syndrome: Secondary | ICD-10-CM | POA: Diagnosis not present

## 2017-05-12 DIAGNOSIS — D649 Anemia, unspecified: Secondary | ICD-10-CM | POA: Diagnosis not present

## 2017-05-12 DIAGNOSIS — M6281 Muscle weakness (generalized): Secondary | ICD-10-CM | POA: Diagnosis not present

## 2017-05-12 DIAGNOSIS — I639 Cerebral infarction, unspecified: Secondary | ICD-10-CM | POA: Diagnosis not present

## 2017-05-12 DIAGNOSIS — H40033 Anatomical narrow angle, bilateral: Secondary | ICD-10-CM | POA: Diagnosis not present

## 2017-05-12 DIAGNOSIS — R2681 Unsteadiness on feet: Secondary | ICD-10-CM | POA: Diagnosis not present

## 2017-05-12 DIAGNOSIS — I2699 Other pulmonary embolism without acute cor pulmonale: Secondary | ICD-10-CM | POA: Diagnosis not present

## 2017-05-12 DIAGNOSIS — J449 Chronic obstructive pulmonary disease, unspecified: Secondary | ICD-10-CM | POA: Diagnosis not present

## 2017-05-12 DIAGNOSIS — I10 Essential (primary) hypertension: Secondary | ICD-10-CM | POA: Diagnosis not present

## 2017-05-12 DIAGNOSIS — Z79899 Other long term (current) drug therapy: Secondary | ICD-10-CM | POA: Diagnosis not present

## 2017-05-12 DIAGNOSIS — I6930 Unspecified sequelae of cerebral infarction: Secondary | ICD-10-CM | POA: Diagnosis not present

## 2017-05-12 DIAGNOSIS — I69354 Hemiplegia and hemiparesis following cerebral infarction affecting left non-dominant side: Secondary | ICD-10-CM | POA: Diagnosis not present

## 2017-05-12 MED ORDER — BISACODYL 5 MG PO TBEC
5.0000 mg | DELAYED_RELEASE_TABLET | Freq: Every day | ORAL | 0 refills | Status: AC | PRN
Start: 2017-05-12 — End: ?

## 2017-05-12 MED ORDER — CLOPIDOGREL BISULFATE 75 MG PO TABS: 75.0000 mg | ORAL_TABLET | Freq: Every day | ORAL | Status: AC

## 2017-05-12 MED ORDER — ONDANSETRON HCL 4 MG PO TABS: 4.0000 mg | ORAL_TABLET | Freq: Four times a day (QID) | ORAL | 0 refills | Status: AC | PRN

## 2017-05-12 MED ORDER — ASPIRIN 325 MG PO TABS: 325.0000 mg | ORAL_TABLET | Freq: Every day | ORAL | Status: AC

## 2017-05-12 MED ORDER — ATORVASTATIN CALCIUM 80 MG PO TABS: 80.0000 mg | ORAL_TABLET | Freq: Every day | ORAL | Status: AC

## 2017-05-12 NOTE — Clinical Social Work Placement (Signed)
   CLINICAL SOCIAL WORK PLACEMENT  NOTE  Date:  05/12/2017  Patient Details  Name: [name] MRN: [id number] Date of Birth: [birth year]  Clinical Social Work is seeking post-discharge placement for this patient at the Skilled  Nursing Facility level of care (*CSW will initial, date and re-position this form in  chart as items are completed):  Yes   Patient/family provided with [facility] Clinical Social Work Department's list of facilities offering this level of care within the geographic area requested by the patient (or if unable, by the patient's family).  Yes   Patient/family informed of their freedom to choose among providers that offer the needed level of care, that participate in Medicare, Medicaid or managed care program needed by the patient, have an available bed and are willing to accept the patient.  Yes   Patient/family informed of [facility]'s ownership interest in [facility] and [facility], as well as of the fact that they are under no obligation to receive care at these facilities.  PASRR submitted to EDS on 05/12/17     PASRR number received on 05/12/17     Existing PASRR number confirmed on       FL2 transmitted to all facilities in geographic area requested by pt/family on 05/12/17     FL2 transmitted to all facilities within larger geographic area on       Patient informed that his/her managed care company has contracts with or will negotiate with certain facilities, including the following:        Yes   Patient/family informed of bed offers received.  [facility]     Physician recommends and patient chooses bed at      [facility] 05[facility]/17.  Patient to be transferred to facility by neice, Lamarr LulasBrenda Russell     Patient family notified on 05/12/17 of transfer.  Name of family member notified:  neice, Lamarr LulasBrenda Russell     PHYSICIAN       Additional Comment:  Facility  notified and discharge clinicals sent. LCSW signing off.   _______________________________________________ Annice NeedySettle, Epifanio Labrador D, LCSW 05/12/2017, 11:23 AM

## 2017-05-12 NOTE — Care Management Important Message (Signed)
Important Message  Patient Details  Name: Sheena ChesterLillie M Wilkinson MRN: [id number] Date of Birth: 1934-09-12   Medicare Important Message Given:  Yes    Malcolm MetroChildress, Shaya Reddick Demske, RN 05/12/2017, 11:28 AM

## 2017-05-12 NOTE — Progress Notes (Signed)
Patient discharged with IV removed and site intact. Patient discharged with personal belongings and pain med script. D/C to [facility] [place] [facility] by niece.

## 2017-05-12 NOTE — Progress Notes (Signed)
Physical Therapy Treatment Patient Details Name: [name] MRN: [id number] DOB: Jan 03, 1935 Today's Date: 05/12/2017    History of Present Illness [name]  is a 82 y.o. female, history of arthritis, hypertension and COPD was brought to the hospital after she was found on the floor.  She was last known to be normal about 16 hours back ?last evening when 1 of her neice spoke with her on the phone.  She did not answer her door this morning and her nephew had to break down the door to get in.  EMS found patient to have abnormal gait, left-sided weakness and left-sided facial droop.  Patient was alert during my conversation but had impaired vision on her left eye and left lateral gaze.  She informs that she was having frequent falls at home the past week about 3-4 falls yesterday itself.  Patient lives alone and is fairly independent and has not had similar symptoms in the past.    PT Comments    Patient has difficulty compensating left sided visual deficits using right eye due to only turning head to the left when given verbal cues, otherwise patient walks into objects on her left side.  Patient will benefit from continued physical therapy in hospital and recommended venue below to increase strength, balance, endurance for safe ADLs and gait.    Follow Up Recommendations  SNF;Supervision/Assistance - 24 hour     Equipment Recommendations  Rolling walker with 5" wheels    Recommendations for Other Services       Precautions / Restrictions Precautions Precautions: Fall Restrictions Weight Bearing Restrictions: No    Mobility  Bed Mobility Overal bed mobility: Modified Independent                Transfers Overall transfer level: Needs assistance Equipment used: Straight cane Transfers: Sit to/from Stand;Stand Pivot Transfers Sit to Stand: Min guard Stand pivot transfers: Min guard       General transfer comment: demonstrates occasional neglict of left  side  Ambulation/Gait Ambulation/Gait assistance: Min assist Ambulation Distance (Feet): 45 Feet Assistive device: Straight cane Gait Pattern/deviations: Decreased step length - left;Decreased stride length   Gait velocity interpretation: Below normal speed for age/gender General Gait Details: frequent bumping into wall, objects on the left side, has to make wide turns to turnaround, limited secondary to fatigue   Stairs            Wheelchair Mobility    Modified Rankin (Stroke Patients Only)       Balance Overall balance assessment: Needs assistance Sitting-balance support: No upper extremity supported;Feet supported Sitting balance-Leahy Scale: Good     Standing balance support: No upper extremity supported;Single extremity supported;During functional activity Standing balance-Leahy Scale: Fair                              Cognition Arousal/Alertness: Awake/alert Behavior During Therapy: WFL for tasks assessed/performed Overall Cognitive Status: Within Functional Limits for tasks assessed                                        Exercises General Exercises - Lower Extremity Ankle Circles/Pumps: Seated;AROM;Strengthening;Both;10 reps Long Arc Quad: Seated;AROM;Strengthening;Both;10 reps Hip Flexion/Marching: AROM;Seated;Strengthening;Both;10 reps    General Comments        Pertinent Vitals/Pain Pain Assessment: No/denies pain    Home Living  Prior Function            PT Goals (current goals can now be found in the care plan section) Acute Rehab PT Goals Patient Stated Goal: return home  PT Goal Formulation: With patient Time For Goal Achievement: 05/15/17 Potential to Achieve Goals: Good Progress towards PT goals: Progressing toward goals    Frequency    7X/week      PT Plan Current plan remains appropriate    Co-evaluation              AM-PAC PT "6 Clicks" Daily Activity   Outcome Measure  Difficulty turning over in bed (including adjusting bedclothes, sheets and blankets)?: None Difficulty moving from lying on back to sitting on the side of the bed? : None Difficulty sitting down on and standing up from a chair with arms (e.g., wheelchair, bedside commode, etc,.)?: None Help needed moving to and from a bed to chair (including a wheelchair)?: A Little Help needed walking in hospital room?: A Little Help needed climbing 3-5 steps with a railing? : A Little 6 Click Score: 21    End of Session Equipment Utilized During Treatment: Gait belt Activity Tolerance: Patient tolerated treatment well;Patient limited by fatigue Patient left: with call bell/phone within reach;in chair Nurse Communication: Mobility status PT Visit Diagnosis: Unsteadiness on feet (R26.81);Other abnormalities of gait and mobility (R26.89);Muscle weakness (generalized) (M62.81)     Time: [id number] PT Time Calculation (min) (ACUTE ONLY): 26 min  Charges:  $Therapeutic Activity: 23-37 mins                    G Codes:       11:32 AM, 05/12/17 Ocie BobJames Mehr Depaoli, MPT Physical Therapist [facility] [phone] office [phone] mobile phone

## 2017-05-12 NOTE — Discharge Summary (Addendum)
Physician Discharge Summary  Patient ID: [name] MRN: [id number] DOB/AGE: 1934-10-01 82 y.o. Primary Care Physician:Keyante Durio, Ramon DredgeEdward, MD Admit date: 05/09/2017 Discharge date: 05/12/2017    Discharge Diagnoses:   Principal Problem:   Acute embolic stroke [facility](HCC) Active Problems:   Essential hypertension   Glaucoma   Stroke [facility](HCC) copd Osteoporosis Hyperlipidemia  Allergies as of 05/12/2017   No Known Allergies     Medication List    STOP taking these medications   denosumab 60 MG/ML Soln injection Commonly known as:  PROLIA   nabumetone 500 MG tablet Commonly known as:  RELAFEN   naproxen 500 MG tablet Commonly known as:  NAPROSYN   raloxifene 60 MG tablet Commonly known as:  EVISTA     TAKE these medications   aspirin 325 MG tablet Take 1 tablet (325 mg total) by mouth daily.   atorvastatin 80 MG tablet Commonly known as:  LIPITOR Take 1 tablet (80 mg total) by mouth daily at 6 PM.   bimatoprost 0.01 % Soln Commonly known as:  LUMIGAN Place 1 drop into the left eye at bedtime.   bisacodyl 5 MG EC tablet Commonly known as:  DULCOLAX Take 1 tablet (5 mg total) by mouth daily as needed for moderate constipation.   brimonidine 0.2 % ophthalmic solution Commonly known as:  ALPHAGAN Place 1 drop into both eyes 2 (two) times daily.   CALCIUM 500 + D 500-125 MG-UNIT Tabs Generic drug:  Calcium Carbonate-Vitamin D Take 1 tablet by mouth daily.   clopidogrel 75 MG tablet Commonly known as:  PLAVIX Take 1 tablet (75 mg total) by mouth daily with breakfast.   dorzolamide-timolol 22.3-6.8 MG/ML ophthalmic solution Commonly known as:  COSOPT Place 1 drop into both eyes 2 (two) times daily.   furosemide 40 MG tablet Commonly known as:  LASIX Take 40 mg by mouth daily.   HYDROcodone-acetaminophen 5-325 MG tablet Commonly known as:  NORCO/VICODIN Take 1 tablet by mouth every 6 (six) hours as needed (pain). What changed:  Another medication with the  same name was removed. Continue taking this medication, and follow the directions you see here.   losartan 100 MG tablet Commonly known as:  COZAAR Take 100 mg by mouth daily.   multivitamin with minerals Tabs tablet Take 1 tablet by mouth daily.   omega-3 acid ethyl esters 1 g capsule Commonly known as:  LOVAZA Take 1 g by mouth daily.   omeprazole 20 MG capsule Commonly known as:  PRILOSEC Take 20 mg by mouth daily.   ondansetron 4 MG tablet Commonly known as:  ZOFRAN Take 1 tablet (4 mg total) by mouth every 6 (six) hours as needed for nausea.   tiotropium 18 MCG inhalation capsule Commonly known as:  SPIRIVA Place 18 mcg into inhaler and inhale daily.   verapamil 180 MG 24 hr capsule Commonly known as:  VERELAN PM Take 360 mg by mouth at bedtime.       Discharged Condition: Improved    Consults: Neurology  Significant Diagnostic Studies: Mr Shirlee LatchMra Head WUWo Contrast  Result Date: 05/09/2017 CLINICAL DATA:  82 year old female code stroke presentation with left side symptoms and suspected acute right MCA territory infarct on noncontrast head CT earlier today. EXAM: MRA HEAD WITHOUT CONTRAST TECHNIQUE: Angiographic images of the Circle of Willis were obtained using MRA technique without intravenous contrast. COMPARISON:  Brain MRI today reported separately. FINDINGS: Study is intermittently degraded by motion artifact despite repeated imaging attempts. Antegrade flow in the posterior circulation with  dominant appearing distal right vertebral artery. The distal left vertebral artery appears to terminated in PICA. Patent vertebrobasilar junction and basilar artery without stenosis. Fetal type bilateral PCA origins, more so the right. No proximal PCA occlusion or stenosis but there is distal right PCA multifocal decreased flow signal which is possibly genuine in light of the MRI findings today reported separately. Distal left PCA flow appears within normal limits. Antegrade flow in  both ICA siphons. Carotid termini and bilateral posterior communicating artery origins are patent. MCA and left ACA origins are patent. The left A1 appears dominant and the right diminutive. Anterior communicating artery and proximal ACA branches are patent. Left MCA M1 segment, left MCA bifurcation and proximal left MCA branches are patent. The right MCA M1 segment is patent, but there is asymmetric decreased flow at the right MCA trifurcation and decreased flow signal in the right MCA M2 branches, especially the posterior division. IMPRESSION: 1. Positive for occlusion of the Right MCA posterior right M2 branch. Asymmetric decreased flow signal in the other right MCA branches raising the possibility of thrombus or stenosis at the Right MCA bifurcation. 2. Positive also for fetal type PCA origins with distal Right PCA stenosis or occlusion. Note that this is concordant with the Right MCA/PCA watershed area infarct involvement on today MRI. 3. Motion degraded with no other large vessel occlusion or stenosis. Non dominant distal left vertebral artery appears to terminates in PICA. Dominant left A1 segment suspected. Electronically Signed   By: Odessa FlemingH  Hall M.D.   On: 05/09/2017 15:46   Mr Brain Wo Contrast  Addendum Date: 05/09/2017   ADDENDUM REPORT: 05/09/2017 16:09 ADDENDUM: I briefly discussed the salient MRI and intracranial MRA findings on this patient with RN [name] on 05/09/2017 at 1600 hours. Electronically Signed   By: Odessa FlemingH  Hall M.D.   On: 05/09/2017 16:09   Result Date: 05/09/2017 CLINICAL DATA:  82 year old female code stroke presentation with left side symptoms and suspected acute right MCA territory infarct on noncontrast head CT earlier today. EXAM: MRI HEAD WITHOUT CONTRAST TECHNIQUE: Multiplanar, multiecho pulse sequences of the brain and surrounding structures were obtained without intravenous contrast. COMPARISON:  Noncontrast head CT 1221 hours today. FINDINGS: Brain: Confluent restricted  diffusion throughout the posterior right MCA territory. Superior peri-Rolandic cortex relatively spared. Right parietal lobe, posterior insula, and posterior temporal lobe a especially are affected. Involvement of the right MCA PCA watershed territory including the lateral aspect and some of the central right occipital lobe (series 1, image 83). Minimal cytotoxic edema associated. No associated acute hemorrhage. No intracranial mass effect at this time. Core infarct measures up to 6 cm longest dimension. No contralateral left hemisphere restricted diffusion, but there are 2 small foci of right cerebellar hemisphere diffusion restriction on series 1 images 68 and 71. Unrelated chronic microhemorrhage in the right deep cerebellar nuclei. No cerebellar edema or acute hemorrhage. Confluent bilateral cerebral white matter T2 and FLAIR hyperintensity. Chronic lacunar infarcts in the bilateral deep gray matter nuclei. No cortical encephalomalacia or other chronic cerebral blood products. No midline shift, mass effect, evidence of mass lesion, ventriculomegaly, extra-axial collection or acute intracranial hemorrhage. Cervicomedullary junction and pituitary are within normal limits. Vascular: Major intracranial vascular flow voids are grossly preserved at the skullbase. There is abnormal increased FLAIR signal in posterior right MCA branch(es). See MRA findings today reported separately. Skull and upper cervical spine: Normal bone marrow signal. Negative visible cervical spine. Sinuses/Orbits: Postoperative changes to both globes. Paranasal sinuses are stable and well  pneumatized. Other: Mild mastoid effusions. Visible scalp and face soft tissues appear negative. IMPRESSION: 1. Moderate to large acute infarct in the posterior Right MCA territory, and affecting the right MCA/PCA watershed. Minimal edema with no hemorrhage or mass effect at this time. 2. Abnormal posterior Right MCA branches, see MRA today reported separately.  3. Two punctate acute infarcts also suspected in the right cerebellum. Therefore, consider a recent embolic event from the heart or proximal aorta. Electronically Signed: By: Odessa Fleming M.D. On: 05/09/2017 15:40   US Carotid Bilateral (at [facility] Only)  Result Date: 05/10/2017 CLINICAL DATA:  82 year old female with a history of CVA. Cardiovascular risk factors include hypertension, stroke/TIA, hyperlipidemia, tobacco use EXAM: BILATERAL CAROTID DUPLEX ULTRASOUND TECHNIQUE: Wallace Cullens scale imaging, color Doppler and duplex ultrasound were performed of bilateral carotid and vertebral arteries in the neck. COMPARISON:  No prior duplex FINDINGS: Criteria: Quantification of carotid stenosis is based on velocity parameters that correlate the residual internal carotid diameter with NASCET-based stenosis levels, using the diameter of the distal internal carotid lumen as the denominator for stenosis measurement. The following velocity measurements were obtained: RIGHT ICA:  Systolic 39 cm/sec, Diastolic 5 cm/sec CCA:  95 cm/sec SYSTOLIC ICA/CCA RATIO:  0.4 ECA:  80 cm/sec LEFT ICA:  Systolic 112 cm/sec, Diastolic 24 cm/sec CCA:  111 cm/sec SYSTOLIC ICA/CCA RATIO:  1.0 ECA:  179 cm/sec Right Brachial SBP: Not acquired Left Brachial SBP: Not acquired RIGHT CAROTID ARTERY: No significant calcified disease of the right common carotid artery. Intermediate waveform maintained. Heterogeneous plaque without significant calcifications at the right carotid bifurcation. Low resistance waveform of the right ICA. No significant tortuosity. RIGHT VERTEBRAL ARTERY: Antegrade flow with low resistance waveform. LEFT CAROTID ARTERY: No significant calcified disease of the left common carotid artery. Intermediate waveform maintained. Heterogeneous plaque at the left carotid bifurcation without significant calcifications. Low resistance waveform of the left ICA. LEFT VERTEBRAL ARTERY:  Antegrade flow with low resistance waveform. IMPRESSION:  Color duplex indicates minimal heterogeneous plaque, with no hemodynamically significant stenosis by duplex criteria in the extracranial cerebrovascular circulation. Signed, Yvone Neu. Loreta Ave, DO [facility] Electronically Signed   By: Gilmer Mor D.O.   On: 05/10/2017 09:58   Dg Chest Port 1 View  Result Date: 05/09/2017 CLINICAL DATA:  Cough. EXAM: PORTABLE CHEST 1 VIEW COMPARISON:  Radiographs of June 17, 2015. FINDINGS: Mild cardiomegaly is noted with mild central pulmonary vascular congestion. Atherosclerosis of thoracic aorta is noted. No pneumothorax or pleural effusion is noted. Bony thorax is unremarkable. No consolidative process is noted. IMPRESSION: Mild cardiomegaly with mild central pulmonary vascular congestion. Aortic atherosclerosis. Electronically Signed   By: Lupita Raider, M.D.   On: 05/09/2017 13:04   Ct Head Code Stroke Wo Contrast  Result Date: 05/09/2017 CLINICAL DATA:  Code stroke. Left-sided weakness. Left facial droop. EXAM: CT HEAD WITHOUT CONTRAST TECHNIQUE: Contiguous axial images were obtained from the base of the skull through the vertex without intravenous contrast. COMPARISON:  02/13/2008 FINDINGS: Brain: There is a small area of decreased gray-white differentiation in the supraganglionic right frontoparietal region which may reflect an acute infarct. There is increased patchy low attenuation in the right lentiform nucleus compared to the prior CT. There is chronic hypoattenuation in the right internal capsule. A chronic lacunar infarct is again seen in the left lentiform nucleus, although there may be an additional new age indeterminate left lentiform nucleus lacunar infarct, and there also appear to be new age indeterminate  lacunar infarcts in both thalami. Patchy to confluent hypodensities throughout the cerebral white matter bilaterally have mildly progressed and are compatible with moderately age  advanced chronic small vessel ischemia. No acute intracranial hemorrhage, mass, midline shift, or extra-axial fluid collection is identified. Vascular: Calcified atherosclerosis at the skullbase. Asymmetric increased density of the right MCA at the M2 level. Skull: No fracture focal osseous lesion. Sinuses/Orbits: Visualized paranasal sinuses and mastoid air cells are clear. Bilateral cataract extraction. Other: None. ASPECTS [facility] Stroke Program Early CT Score) - Ganglionic level infarction (caudate, lentiform nuclei, internal capsule, insula, M1-M3 cortex): 6-7 (age indeterminate right lentiform nucleus infarct) - Supraganglionic infarction (M4-M6 cortex): 2 Total score (0-10 with 10 being normal): 8-9 IMPRESSION: 1. Suspected acute right MCA infarct involving right frontoparietal cortex and possibly right lentiform nucleus. No hemorrhage. 2. ASPECTS is 8-9. 3. Hyperdense right MCA at the M2 level which may reflect acute embolus. 4. Progressive chronic small vessel ischemia including new age indeterminate lacunar infarcts in the deep gray nuclei bilaterally. These results were called by telephone at the time of interpretation on 05/09/2017 at 12:51 pm to Dr. Marily Memos , who verbally acknowledged these results. Electronically Signed   By: Sebastian Ache M.D.   On: 05/09/2017 12:54    Lab Results: Basic Metabolic Panel: Recent Labs    05/09/17 1159 05/09/17 1219 05/10/17 0549  NA 141 143 140  K 4.0 4.2 3.8  CL 113* 115* 112*  CO2 22  --  20*  GLUCOSE 101* 98 91  BUN 12 11 12   CREATININE 1.09* 1.00 0.91  CALCIUM 9.0  --  8.6*   Liver Function Tests: Recent Labs    05/09/17 1159  AST 24  ALT 9*  ALKPHOS 31*  BILITOT 0.5  PROT 7.5  ALBUMIN 3.5     CBC: Recent Labs    05/09/17 1159 05/09/17 1219 05/10/17 0549  WBC DUPLICATE CBC ALREADY DONE  8.3  --  7.0  NEUTROABS 5.0  --   --   HGB DUPLICATE CBC ALREADY DONE  11.4* 12.2 10.4*  HCT DUPLICATE CBC ALREADY DONE  36.8 36.0  34.3*  MCV DUPLICATE CBC ALREADY DONE  85.2  --  85.1  PLT DUPLICATE CBC ALREADY DONE  203  --  184    No results found for this or any previous visit (from the past 240 hour(s)).   Hospital Course: This is a 82 year old who came to the hospital because of difficulty ambulating and multiple falls.  She was found to have acute stroke.  She had consultation with neurology and it was felt that she should be on dual antiplatelet therapy.  She had PT speech and OT evaluation and all agreed that she needed to go to skilled care facility.  She has some visual field defect and tends to run into the walls on her left side.  She still has left-sided weakness.  Discharge Exam: Blood pressure 100/74, pulse (!) 55, temperature 98.9 F (37.2 C), temperature source Oral, resp. rate 18, height 5\' 4"  (1.626 m), weight 70.3 kg (155 lb), SpO2 99 %. Awake and alert.  Still with left-sided weakness.  Still with left-sided visual field defect very hard of hearing.  Disposition: To skilled care facility.  She will be on heart healthy diet.  She will have PT OT and speech  as needed.  Greater than 30 minutes spent on this discharge activity      Signed: Helon Wisinski L   05/12/2017, 7:54 AM

## 2017-05-12 NOTE — Plan of Care (Signed)
Pt alert and oriented x4. Able to make needs known. No complaint of pain or distress at this time. Pt has impaired vision to both eyes and is hard of hearing. Pt has some weakness to left hand grips and left leg pushes. Pt has telemetry in place Sr. IV to right hand NS at 150ml/hr. Call light in place, bed in lowest position. Will continue to monitor.

## 2017-05-15 DIAGNOSIS — J449 Chronic obstructive pulmonary disease, unspecified: Secondary | ICD-10-CM | POA: Diagnosis not present

## 2017-05-15 DIAGNOSIS — I69354 Hemiplegia and hemiparesis following cerebral infarction affecting left non-dominant side: Secondary | ICD-10-CM | POA: Diagnosis not present

## 2017-05-15 DIAGNOSIS — I10 Essential (primary) hypertension: Secondary | ICD-10-CM | POA: Diagnosis not present

## 2017-05-15 DIAGNOSIS — R414 Neurologic neglect syndrome: Secondary | ICD-10-CM | POA: Diagnosis not present

## 2017-05-16 DIAGNOSIS — I10 Essential (primary) hypertension: Secondary | ICD-10-CM | POA: Diagnosis not present

## 2017-05-16 DIAGNOSIS — R414 Neurologic neglect syndrome: Secondary | ICD-10-CM | POA: Diagnosis not present

## 2017-05-16 DIAGNOSIS — I69354 Hemiplegia and hemiparesis following cerebral infarction affecting left non-dominant side: Secondary | ICD-10-CM | POA: Diagnosis not present

## 2017-05-16 DIAGNOSIS — J449 Chronic obstructive pulmonary disease, unspecified: Secondary | ICD-10-CM | POA: Diagnosis not present

## 2017-06-07 DIAGNOSIS — I69354 Hemiplegia and hemiparesis following cerebral infarction affecting left non-dominant side: Secondary | ICD-10-CM | POA: Diagnosis not present

## 2017-06-07 DIAGNOSIS — J449 Chronic obstructive pulmonary disease, unspecified: Secondary | ICD-10-CM | POA: Diagnosis not present

## 2017-06-07 DIAGNOSIS — R414 Neurologic neglect syndrome: Secondary | ICD-10-CM | POA: Diagnosis not present

## 2017-06-07 DIAGNOSIS — I10 Essential (primary) hypertension: Secondary | ICD-10-CM | POA: Diagnosis not present

## 2017-06-29 DIAGNOSIS — I639 Cerebral infarction, unspecified: Secondary | ICD-10-CM | POA: Diagnosis not present

## 2017-06-29 DIAGNOSIS — Z961 Presence of intraocular lens: Secondary | ICD-10-CM | POA: Diagnosis not present

## 2017-06-29 DIAGNOSIS — I10 Essential (primary) hypertension: Secondary | ICD-10-CM | POA: Diagnosis not present

## 2017-06-29 DIAGNOSIS — R2681 Unsteadiness on feet: Secondary | ICD-10-CM | POA: Diagnosis not present

## 2017-06-29 DIAGNOSIS — I69354 Hemiplegia and hemiparesis following cerebral infarction affecting left non-dominant side: Secondary | ICD-10-CM | POA: Diagnosis not present

## 2017-06-29 DIAGNOSIS — M1991 Primary osteoarthritis, unspecified site: Secondary | ICD-10-CM | POA: Diagnosis not present

## 2017-06-29 DIAGNOSIS — J449 Chronic obstructive pulmonary disease, unspecified: Secondary | ICD-10-CM | POA: Diagnosis not present

## 2017-06-29 DIAGNOSIS — H409 Unspecified glaucoma: Secondary | ICD-10-CM | POA: Diagnosis not present

## 2017-07-04 DIAGNOSIS — I10 Essential (primary) hypertension: Secondary | ICD-10-CM | POA: Diagnosis not present

## 2017-07-04 DIAGNOSIS — M1991 Primary osteoarthritis, unspecified site: Secondary | ICD-10-CM | POA: Diagnosis not present

## 2017-07-04 DIAGNOSIS — I69354 Hemiplegia and hemiparesis following cerebral infarction affecting left non-dominant side: Secondary | ICD-10-CM | POA: Diagnosis not present

## 2017-07-04 DIAGNOSIS — H409 Unspecified glaucoma: Secondary | ICD-10-CM | POA: Diagnosis not present

## 2017-07-04 DIAGNOSIS — J449 Chronic obstructive pulmonary disease, unspecified: Secondary | ICD-10-CM | POA: Diagnosis not present

## 2017-07-14 ENCOUNTER — Other Ambulatory Visit: Payer: Self-pay

## 2017-07-14 ENCOUNTER — Emergency Department (HOSPITAL_COMMUNITY): Payer: Medicare Other

## 2017-07-14 ENCOUNTER — Encounter (HOSPITAL_COMMUNITY): Payer: Self-pay | Admitting: Emergency Medicine

## 2017-07-14 ENCOUNTER — Emergency Department (HOSPITAL_COMMUNITY)
Admission: EM | Admit: 2017-07-14 | Discharge: 2017-07-14 | Discharge status: Against Medical Advice | Payer: Medicare Other | Attending: Emergency Medicine | Admitting: Emergency Medicine

## 2017-07-14 DIAGNOSIS — Z5321 Procedure and treatment not carried out due to patient leaving prior to being seen by health care provider: Secondary | ICD-10-CM | POA: Insufficient documentation

## 2017-07-14 DIAGNOSIS — R079 Chest pain, unspecified: Secondary | ICD-10-CM | POA: Insufficient documentation

## 2017-07-14 HISTORY — DX: Cerebral infarction, unspecified: I63.9

## 2017-07-14 LAB — CBC
HCT: 36.7 % (ref 36.0–46.0)
Hemoglobin: 11.5 g/dL — ABNORMAL LOW (ref 12.0–15.0)
MCH: 25.3 pg — AB (ref 26.0–34.0)
MCHC: 31.3 g/dL (ref 30.0–36.0)
MCV: 80.7 fL (ref 78.0–100.0)
PLATELETS: 219 10*3/uL (ref 150–400)
RBC: 4.55 MIL/uL (ref 3.87–5.11)
RDW: 15 % (ref 11.5–15.5)
WBC: 6.1 10*3/uL (ref 4.0–10.5)

## 2017-07-14 LAB — BASIC METABOLIC PANEL
Anion gap: 10 (ref 5–15)
BUN: 17 mg/dL (ref 6–20)
CALCIUM: 9.9 mg/dL (ref 8.9–10.3)
CO2: 30 mmol/L (ref 22–32)
CREATININE: 1.27 mg/dL — AB (ref 0.44–1.00)
Chloride: 102 mmol/L (ref 101–111)
GFR calc non Af Amer: 39 mL/min — ABNORMAL LOW (ref >60)
GFR, EST AFRICAN AMERICAN: 45 mL/min — AB (ref >60)
Glucose, Bld: 102 mg/dL — ABNORMAL HIGH (ref 65–99)
Potassium: 3.7 mmol/L (ref 3.5–5.1)
SODIUM: 142 mmol/L (ref 135–145)

## 2017-07-14 LAB — TROPONIN I: TROPONIN I: 0.03 ng/mL — AB (ref <0.03)

## 2017-07-14 NOTE — ED Triage Notes (Signed)
Patient c/o intermittent left side chest pain "for a while." Denies any shortness of breath or dizziness. Patient reports nausea and weakness. Patient does report productive cough with thick yellow sputum. Patient also c/o right lower abd pain. Denies any fevers or diarrhea.

## 2017-07-14 NOTE — ED Notes (Signed)
Pt called x1. No answer. 

## 2017-07-14 NOTE — ED Notes (Signed)
Called x 3 no answer 

## 2017-07-14 NOTE — ED Notes (Signed)
Pt called x2 no answer 

## 2017-07-18 DIAGNOSIS — H548 Legal blindness, as defined in USA: Secondary | ICD-10-CM | POA: Diagnosis not present

## 2017-07-18 DIAGNOSIS — J441 Chronic obstructive pulmonary disease with (acute) exacerbation: Secondary | ICD-10-CM | POA: Diagnosis not present

## 2017-07-18 DIAGNOSIS — I10 Essential (primary) hypertension: Secondary | ICD-10-CM | POA: Diagnosis not present

## 2017-07-18 DIAGNOSIS — I639 Cerebral infarction, unspecified: Secondary | ICD-10-CM | POA: Diagnosis not present

## 2017-08-18 DIAGNOSIS — H548 Legal blindness, as defined in USA: Secondary | ICD-10-CM | POA: Diagnosis not present

## 2017-08-18 DIAGNOSIS — I10 Essential (primary) hypertension: Secondary | ICD-10-CM | POA: Diagnosis not present

## 2017-08-18 DIAGNOSIS — J449 Chronic obstructive pulmonary disease, unspecified: Secondary | ICD-10-CM | POA: Diagnosis not present

## 2017-08-18 DIAGNOSIS — H9193 Unspecified hearing loss, bilateral: Secondary | ICD-10-CM | POA: Diagnosis not present

## 2017-10-19 DIAGNOSIS — I10 Essential (primary) hypertension: Secondary | ICD-10-CM | POA: Diagnosis not present

## 2017-10-19 DIAGNOSIS — J449 Chronic obstructive pulmonary disease, unspecified: Secondary | ICD-10-CM | POA: Diagnosis not present

## 2017-10-19 DIAGNOSIS — H548 Legal blindness, as defined in USA: Secondary | ICD-10-CM | POA: Diagnosis not present

## 2017-10-19 DIAGNOSIS — I6930 Unspecified sequelae of cerebral infarction: Secondary | ICD-10-CM | POA: Diagnosis not present

## 2017-10-23 ENCOUNTER — Ambulatory Visit (INDEPENDENT_AMBULATORY_CARE_PROVIDER_SITE_OTHER): Payer: Medicare Other | Admitting: Otolaryngology

## 2017-10-23 DIAGNOSIS — H7201 Central perforation of tympanic membrane, right ear: Secondary | ICD-10-CM

## 2017-10-23 DIAGNOSIS — H906 Mixed conductive and sensorineural hearing loss, bilateral: Secondary | ICD-10-CM | POA: Diagnosis not present

## 2017-11-15 ENCOUNTER — Emergency Department (HOSPITAL_COMMUNITY)
Admission: EM | Admit: 2017-11-15 | Discharge: 2017-11-15 | Disposition: A | Discharge status: Home / Self Care | Payer: Medicare Other | Attending: Emergency Medicine | Admitting: Emergency Medicine

## 2017-11-15 ENCOUNTER — Encounter (HOSPITAL_COMMUNITY): Payer: Self-pay | Admitting: Emergency Medicine

## 2017-11-15 DIAGNOSIS — Z7982 Long term (current) use of aspirin: Secondary | ICD-10-CM | POA: Diagnosis not present

## 2017-11-15 DIAGNOSIS — Z7902 Long term (current) use of antithrombotics/antiplatelets: Secondary | ICD-10-CM | POA: Diagnosis not present

## 2017-11-15 DIAGNOSIS — Z658 Other specified problems related to psychosocial circumstances: Secondary | ICD-10-CM

## 2017-11-15 DIAGNOSIS — J449 Chronic obstructive pulmonary disease, unspecified: Secondary | ICD-10-CM | POA: Insufficient documentation

## 2017-11-15 DIAGNOSIS — I10 Essential (primary) hypertension: Secondary | ICD-10-CM | POA: Diagnosis not present

## 2017-11-15 DIAGNOSIS — F600 Paranoid personality disorder: Secondary | ICD-10-CM | POA: Insufficient documentation

## 2017-11-15 DIAGNOSIS — Z79899 Other long term (current) drug therapy: Secondary | ICD-10-CM | POA: Insufficient documentation

## 2017-11-15 DIAGNOSIS — R52 Pain, unspecified: Secondary | ICD-10-CM | POA: Diagnosis not present

## 2017-11-15 DIAGNOSIS — Z87891 Personal history of nicotine dependence: Secondary | ICD-10-CM | POA: Insufficient documentation

## 2017-11-15 NOTE — ED Provider Notes (Signed)
[facility] EMERGENCY DEPARTMENT Provider Note   CSN: [id number] Arrival date & time: 11/15/17  1201     History   Chief Complaint Chief Complaint  Patient presents with  . Paranoid    HPI [name] is a 83 y.o. female.  HPI   82yF brought in for evaluation. Apparently patient called 911 earlier stating she had back pain although she says she isnt having any now. She lives with her niece. Pt concerned that she is left alone at times. She has poor eye sight and difficulty hearing which makes he uncomfortable when she is left alone.  She otherwise feels safe at home.   Past Medical History:  Diagnosis Date  . Arthritis   . COPD (chronic obstructive pulmonary disease) (HCC)   . Hypertension   . Stroke [facility])     Patient Active Problem List   Diagnosis Date Noted  . Acute embolic stroke (HCC) 05/09/2017  . Stroke (HCC) 05/09/2017  . Falls frequently   . Essential hypertension   . Acute metabolic encephalopathy   . Glaucoma     Past Surgical History:  Procedure Laterality Date  . EYE SURGERY       OB History   None      Home Medications    Prior to Admission medications   Medication Sig Start Date End Date Taking? Authorizing Provider  tiotropium (SPIRIVA) 18 MCG inhalation capsule Place 18 mcg into inhaler and inhale daily.   Yes [provider]  aspirin 325 MG tablet Take 1 tablet (325 mg total) by mouth daily. 05/12/17   Kari Baars, MD  atorvastatin (LIPITOR) 80 MG tablet Take 1 tablet (80 mg total) by mouth daily at 6 PM. 05/12/17   Kari Baars, MD  bimatoprost (LUMIGAN) 0.01 % SOLN Place 1 drop into the left eye at bedtime.    [provider]  bisacodyl (DULCOLAX) 5 MG EC tablet Take 1 tablet (5 mg total) by mouth daily as needed for moderate constipation. 05/12/17   Kari Baars, MD  brimonidine (ALPHAGAN) 0.2 % ophthalmic solution Place 1 drop into both eyes 2 (two) times daily.    [provider]  Calcium  Carbonate-Vitamin D (CALCIUM 500 + D) 500-125 MG-UNIT TABS Take 1 tablet by mouth daily.    [provider]  clopidogrel (PLAVIX) 75 MG tablet Take 1 tablet (75 mg total) by mouth daily with breakfast. 05/12/17   Kari Baars, MD  dorzolamide-timolol (COSOPT) 22.3-6.8 MG/ML ophthalmic solution Place 1 drop into both eyes 2 (two) times daily.    [provider]  furosemide (LASIX) 40 MG tablet Take 40 mg by mouth daily.    [provider]  HYDROcodone-acetaminophen (NORCO/VICODIN) 5-325 MG per tablet Take 1 tablet by mouth every 6 (six) hours as needed (pain).    [provider]  losartan (COZAAR) 100 MG tablet Take 100 mg by mouth daily.    [provider]  Multiple Vitamin (MULTIVITAMIN WITH MINERALS) TABS Take 1 tablet by mouth daily.    [provider]  omega-3 acid ethyl esters (LOVAZA) 1 G capsule Take 1 g by mouth daily.    [provider]  omeprazole (PRILOSEC) 20 MG capsule Take 20 mg by mouth daily.    [provider]  ondansetron (ZOFRAN) 4 MG tablet Take 1 tablet (4 mg total) by mouth every 6 (six) hours as needed for nausea. 05/12/17   Kari Baars, MD  verapamil (VERELAN PM) 180 MG 24 hr capsule Take 360  mg by mouth at bedtime.    [provider]    Family History No family history on file.  Social History Social History   Tobacco Use  . Smoking status: Former Smoker    Types: Cigarettes  . Smokeless tobacco: Never Used  Substance Use Topics  . Alcohol use: No    Frequency: Never  . Drug use: No     Allergies   Patient has no known allergies.   Review of Systems Review of Systems  All systems reviewed and negative, other than as noted in HPI.  Physical Exam Updated Vital Signs BP (!) 180/77 (BP Location: Right Arm)   Pulse 82   Temp 98.5 F (36.9 C) (Oral)   Resp 16   Wt 70.3 kg (155 lb)   SpO2 96%   BMI 29.29 kg/m   Physical Exam  Constitutional: She is oriented to  person, place, and time. She appears well-developed and well-nourished. No distress.  HENT:  Head: Normocephalic and atraumatic.  Eyes: Conjunctivae are normal. Right eye exhibits no discharge. Left eye exhibits no discharge.  Neck: Neck supple.  Cardiovascular: Normal rate, regular rhythm and normal heart sounds. Exam reveals no gallop and no friction rub.  No murmur heard. Pulmonary/Chest: Effort normal and breath sounds normal. No respiratory distress.  Abdominal: Soft. She exhibits no distension. There is no tenderness.  Musculoskeletal: She exhibits no edema or tenderness.  Neurological: She is alert and oriented to person, place, and time.  Hard of hearing. Speech clear. Content appropriate. Walks with cane.   Skin: Skin is warm and dry.  Psychiatric: She has a normal mood and affect. Her behavior is normal. Thought content normal.  Nursing note and vitals reviewed.    ED Treatments / Results  Labs (all labs ordered are listed, but only abnormal results are displayed) Labs Reviewed - No data to display  EKG None  Radiology No results found.  Procedures Procedures (including critical care time)  Medications Ordered in ED Medications - No data to display   Initial Impression / Assessment and Plan / ED Course  I have reviewed the triage vital signs and the nursing notes.  Pertinent labs & imaging results that were available during my care of the patient were reviewed by me and considered in my medical decision making (see chart for details).     18XW with concern about being left alone at times as home. She has some discomfort with this because of poor eye sight and hearing, but otherwise doesn't feel unsafe. Tried to reassure patient although unrealistic of her to expect niece to be with her at all times and it doesn't sound like it is necessary unsafe when she is not.   Final Clinical Impressions(s) / ED Diagnoses   Final diagnoses:  Domestic concerns    ED  Discharge Orders    None       Raeford Razor, MD 11/17/17 1510
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
## 2017-11-21 DIAGNOSIS — H548 Legal blindness, as defined in USA: Secondary | ICD-10-CM | POA: Diagnosis not present

## 2017-11-21 DIAGNOSIS — J449 Chronic obstructive pulmonary disease, unspecified: Secondary | ICD-10-CM | POA: Diagnosis not present

## 2017-11-21 DIAGNOSIS — I10 Essential (primary) hypertension: Secondary | ICD-10-CM | POA: Diagnosis not present

## 2017-11-21 DIAGNOSIS — I6930 Unspecified sequelae of cerebral infarction: Secondary | ICD-10-CM | POA: Diagnosis not present

## 2017-12-20 ENCOUNTER — Ambulatory Visit (HOSPITAL_COMMUNITY)
Admission: RE | Admit: 2017-12-20 | Discharge: 2017-12-20 | Disposition: A | Discharge status: Home / Self Care | Payer: Medicare Other | Source: Ambulatory Visit | Attending: Pulmonary Disease | Admitting: Pulmonary Disease

## 2017-12-20 ENCOUNTER — Other Ambulatory Visit (HOSPITAL_COMMUNITY): Payer: Self-pay | Admitting: Pulmonary Disease

## 2017-12-20 DIAGNOSIS — M25521 Pain in right elbow: Secondary | ICD-10-CM

## 2017-12-20 DIAGNOSIS — S79911A Unspecified injury of right hip, initial encounter: Secondary | ICD-10-CM | POA: Diagnosis not present

## 2017-12-20 DIAGNOSIS — M25511 Pain in right shoulder: Secondary | ICD-10-CM | POA: Diagnosis not present

## 2017-12-20 DIAGNOSIS — M25551 Pain in right hip: Secondary | ICD-10-CM | POA: Diagnosis not present

## 2017-12-20 DIAGNOSIS — S59901A Unspecified injury of right elbow, initial encounter: Secondary | ICD-10-CM | POA: Diagnosis not present

## 2017-12-20 DIAGNOSIS — M7061 Trochanteric bursitis, right hip: Secondary | ICD-10-CM | POA: Insufficient documentation

## 2017-12-22 ENCOUNTER — Encounter (HOSPITAL_COMMUNITY): Payer: Self-pay

## 2017-12-22 ENCOUNTER — Emergency Department (HOSPITAL_COMMUNITY)
Admission: EM | Admit: 2017-12-22 | Discharge: 2017-12-22 | Disposition: A | Discharge status: Home / Self Care | Payer: Medicare Other | Attending: Emergency Medicine | Admitting: Emergency Medicine

## 2017-12-22 DIAGNOSIS — R112 Nausea with vomiting, unspecified: Secondary | ICD-10-CM | POA: Diagnosis not present

## 2017-12-22 DIAGNOSIS — I10 Essential (primary) hypertension: Secondary | ICD-10-CM | POA: Insufficient documentation

## 2017-12-22 DIAGNOSIS — R52 Pain, unspecified: Secondary | ICD-10-CM | POA: Diagnosis not present

## 2017-12-22 DIAGNOSIS — R110 Nausea: Secondary | ICD-10-CM | POA: Diagnosis not present

## 2017-12-22 DIAGNOSIS — Z8673 Personal history of transient ischemic attack (TIA), and cerebral infarction without residual deficits: Secondary | ICD-10-CM | POA: Insufficient documentation

## 2017-12-22 DIAGNOSIS — Z87891 Personal history of nicotine dependence: Secondary | ICD-10-CM | POA: Diagnosis not present

## 2017-12-22 DIAGNOSIS — J449 Chronic obstructive pulmonary disease, unspecified: Secondary | ICD-10-CM | POA: Insufficient documentation

## 2017-12-22 DIAGNOSIS — Z79899 Other long term (current) drug therapy: Secondary | ICD-10-CM | POA: Insufficient documentation

## 2017-12-22 LAB — COMPREHENSIVE METABOLIC PANEL
ALT: 12 U/L (ref 0–44)
AST: 25 U/L (ref 15–41)
Albumin: 3.3 g/dL — ABNORMAL LOW (ref 3.5–5.0)
Alkaline Phosphatase: 59 U/L (ref 38–126)
Anion gap: 11 (ref 5–15)
BUN: 18 mg/dL (ref 8–23)
CHLORIDE: 108 mmol/L (ref 98–111)
CO2: 22 mmol/L (ref 22–32)
Calcium: 9.2 mg/dL (ref 8.9–10.3)
Creatinine, Ser: 1.08 mg/dL — ABNORMAL HIGH (ref 0.44–1.00)
GFR, EST AFRICAN AMERICAN: 55 mL/min — AB (ref >60)
GFR, EST NON AFRICAN AMERICAN: 47 mL/min — AB (ref >60)
Glucose, Bld: 140 mg/dL — ABNORMAL HIGH (ref 70–99)
POTASSIUM: 3.7 mmol/L (ref 3.5–5.1)
SODIUM: 141 mmol/L (ref 135–145)
Total Bilirubin: 0.9 mg/dL (ref 0.3–1.2)
Total Protein: 7.3 g/dL (ref 6.5–8.1)

## 2017-12-22 LAB — URINALYSIS, ROUTINE W REFLEX MICROSCOPIC
BACTERIA UA: NONE SEEN
Bilirubin Urine: NEGATIVE
GLUCOSE, UA: NEGATIVE mg/dL
HGB URINE DIPSTICK: NEGATIVE
KETONES UR: 5 mg/dL — AB
Nitrite: NEGATIVE
PH: 6 (ref 5.0–8.0)
Protein, ur: NEGATIVE mg/dL
Specific Gravity, Urine: 1.016 (ref 1.005–1.030)

## 2017-12-22 LAB — CBC
HEMATOCRIT: 37.6 % (ref 36.0–46.0)
Hemoglobin: 12.5 g/dL (ref 12.0–15.0)
MCH: 26 pg (ref 26.0–34.0)
MCHC: 33.2 g/dL (ref 30.0–36.0)
MCV: 78.3 fL (ref 78.0–100.0)
Platelets: 181 10*3/uL (ref 150–400)
RBC: 4.8 MIL/uL (ref 3.87–5.11)
RDW: 15.7 % — ABNORMAL HIGH (ref 11.5–15.5)
WBC: 9.9 10*3/uL (ref 4.0–10.5)

## 2017-12-22 LAB — LIPASE, BLOOD: LIPASE: 28 U/L (ref 11–51)

## 2017-12-22 NOTE — ED Triage Notes (Signed)
Pt brought in by EMS due to not feeling well yesterday and vomiting  this morning. Denies abdominal pain at this time

## 2017-12-22 NOTE — Discharge Instructions (Addendum)
Start with a clear liquid diet then begin eating regular foods as you feel better.  Return here or see your doctor as needed for problems.

## 2017-12-22 NOTE — ED Notes (Signed)
Call Rosey Batheresa for a ride when ready to be d/c. [phone]

## 2017-12-22 NOTE — ED Notes (Signed)
Rosey Batheresa called at this time for pt pick up.

## 2017-12-22 NOTE — ED Provider Notes (Signed)
[facility] EMERGENCY DEPARTMENT Provider Note   CSN: [id number] Arrival date & time: 12/22/17  4098     History   Chief Complaint Chief Complaint  Patient presents with  . Emesis    HPI [name] is a 83 y.o. female.  HPI   Patient here by EMS for evaluation of "not feeling well with vomiting."  Patient states that her emesis appeared yellow, because she ate cheese and popcorn yesterday.  She denies abdominal pain, back pain, chest pain, shortness of breath, cough, weakness or dizziness.  She has no other complaints.  There are no other known modifying factors.     Past Medical History:  Diagnosis Date  . Arthritis   . COPD (chronic obstructive pulmonary disease) (HCC)   . Hypertension   . Stroke [facility])     Patient Active Problem List   Diagnosis Date Noted  . Acute embolic stroke (HCC) 05/09/2017  . Stroke (HCC) 05/09/2017  . Falls frequently   . Essential hypertension   . Acute metabolic encephalopathy   . Glaucoma     Past Surgical History:  Procedure Laterality Date  . EYE SURGERY       OB History   None      Home Medications    Prior to Admission medications   Medication Sig Start Date End Date Taking? Authorizing Provider  bimatoprost (LUMIGAN) 0.01 % SOLN Place 1 drop into the left eye at bedtime.   Yes [provider]  brimonidine (ALPHAGAN) 0.2 % ophthalmic solution Place 1 drop into both eyes 2 (two) times daily.   Yes [provider]  dorzolamide-timolol (COSOPT) 22.3-6.8 MG/ML ophthalmic solution Place 1 drop into both eyes 2 (two) times daily.   Yes [provider]  losartan (COZAAR) 100 MG tablet Take 100 mg by mouth daily.   Yes [provider]  nabumetone (RELAFEN) 500 MG tablet Take 500 mg by mouth daily.   Yes [provider]  omeprazole (PRILOSEC) 20 MG capsule Take 20 mg by mouth daily.   Yes [provider]  raloxifene (EVISTA) 60 MG tablet Take 1 tablet by mouth daily.    Yes [provider]  sertraline (ZOLOFT) 50 MG tablet Take 50 mg by mouth daily.   Yes [provider]  tiotropium (SPIRIVA) 18 MCG inhalation capsule Place 18 mcg into inhaler and inhale daily.   Yes [provider]  verapamil (VERELAN PM) 180 MG 24 hr capsule Take 360 mg by mouth at bedtime.   Yes [provider]  aspirin 325 MG tablet Take 1 tablet (325 mg total) by mouth daily. Patient not taking: Reported on 12/22/2017 05/12/17   Kari Baars, MD  atorvastatin (LIPITOR) 80 MG tablet Take 1 tablet (80 mg total) by mouth daily at 6 PM. Patient not taking: Reported on 12/22/2017 05/12/17   Kari Baars, MD  bisacodyl (DULCOLAX) 5 MG EC tablet Take 1 tablet (5 mg total) by mouth daily as needed for moderate constipation. Patient not taking: Reported on 12/22/2017 05/12/17   Kari Baars, MD  clopidogrel (PLAVIX) 75 MG tablet Take 1 tablet (75 mg total) by mouth daily with breakfast. Patient not taking: Reported on 12/22/2017 05/12/17   Kari Baars, MD  ondansetron (ZOFRAN) 4 MG tablet Take 1 tablet (4 mg total) by mouth every 6 (six) hours as needed for nausea. Patient not taking: Reported on 12/22/2017 05/12/17   Kari Baars, MD    Family History No family history on file.  Social History  Social History   Tobacco Use  . Smoking status: Former Smoker    Types: Cigarettes  . Smokeless tobacco: Never Used  Substance Use Topics  . Alcohol use: No    Frequency: Never  . Drug use: No     Allergies   Patient has no known allergies.   Review of Systems Review of Systems  All other systems reviewed and are negative.    Physical Exam Updated Vital Signs BP (!) 144/57 (BP Location: Left Arm)   Pulse (!) 116   Temp 98.9 F (37.2 C) (Oral)   Resp 16   Wt 70.3 kg (155 lb)   SpO2 99%   BMI 29.29 kg/m   Physical Exam  Constitutional: She appears well-developed.  Elderly, frail  HENT:  Head: Normocephalic and atraumatic.  Eyes:  Pupils are equal, round, and reactive to light. Conjunctivae and EOM are normal.  Neck: Normal range of motion and phonation normal. Neck supple.  Cardiovascular: Normal rate and regular rhythm.  Pulmonary/Chest: Effort normal and breath sounds normal. No respiratory distress. She exhibits no tenderness.  Abdominal: Soft. She exhibits no distension. There is no tenderness. There is no guarding.  Musculoskeletal: Normal range of motion.  Neurological: She is alert. She exhibits normal muscle tone.  Skin: Skin is warm and dry.  Psychiatric: She has a normal mood and affect. Her behavior is normal.  Nursing note and vitals reviewed.    ED Treatments / Results  Labs (all labs ordered are listed, but only abnormal results are displayed) Labs Reviewed  COMPREHENSIVE METABOLIC PANEL - Abnormal; Notable for the following components:      Result Value   Glucose, Bld 140 (*)    Creatinine, Ser 1.08 (*)    Albumin 3.3 (*)    GFR calc non Af Amer 47 (*)    GFR calc Af Amer 55 (*)    All other components within normal limits  CBC - Abnormal; Notable for the following components:   RDW 15.7 (*)    All other components within normal limits  URINALYSIS, ROUTINE W REFLEX MICROSCOPIC - Abnormal; Notable for the following components:   Ketones, ur 5 (*)    Leukocytes, UA SMALL (*)    Non Squamous Epithelial 0-5 (*)    All other components within normal limits  URINE CULTURE  LIPASE, BLOOD    EKG None  Radiology No results found.  Procedures Procedures (including critical care time)  Medications Ordered in ED Medications - No data to display   Initial Impression / Assessment and Plan / ED Course  I have reviewed the triage vital signs and the nursing notes.  Pertinent labs & imaging results that were available during my care of the patient were reviewed by me and considered in my medical decision making (see chart for details).  Clinical Course as of Dec 23 1318  Fri Dec 22, 2017    1301 Normal  CBC(!) [EW]  1301 Normal except elevated WBC on microscopic and small ketones.  Urinalysis, Routine w reflex microscopic(!) [EW]  1302 Normal except glucose high, creatinine high, albumin low, GFR low  Comprehensive metabolic panel(!) [EW]  1317 Appearance: CLEAR [EW]    Clinical Course User Index [EW] Mancel BaleWentz, Carlos Heber, MD     Patient Vitals for the past 24 hrs:  BP Temp Temp src Pulse Resp SpO2 Weight  12/22/17 1308 (!) 144/57 - - (!) 116 16 99 % -  12/22/17 1030 (!) 157/76 - - 74 16 99 % -  12/22/17 0921 - - - - - - 70.3 kg (155 lb)  12/22/17 0919 (!) 160/68 98.9 F (37.2 C) Oral 84 18 99 % -    1:02 PM Reevaluation with update and discussion. After initial assessment and treatment, an updated evaluation reveals no additional complaints.  No more vomiting here.  She is comfortable.  Patient remains alert and conversant. Mancel Bale   Medical Decision Making: Apparent single episode of vomiting, resolved.  No evidence for bowel obstruction, metabolic instability or serious bacterial infection.  Urinalysis mildly normal, doubt urinary tract infection.  Urine culture sent, to assist with evaluation.  Would not treat for UTI unless she develops symptoms including fever, or inability to eat.  CRITICAL CARE-no Performed by: Mancel Bale   Nursing Notes Reviewed/ Care Coordinated Applicable Imaging Reviewed Interpretation of Laboratory Data incorporated into ED treatment  The patient appears reasonably screened and/or stabilized for discharge and I doubt any other medical condition or other [facility] requiring further screening, evaluation, or treatment in the ED at this time prior to discharge.  Plan: Home Medications-continue current medications; Home Treatments-gradually advance diet; return here if the recommended treatment, does not improve the symptoms; Recommended follow up-PCP as needed.    Final Clinical Impressions(s) / ED Diagnoses   Final diagnoses:   Non-intractable vomiting with nausea, unspecified vomiting type    ED Discharge Orders    None       Mancel Bale, MD 12/22/17 1320

## 2017-12-24 LAB — URINE CULTURE: Culture: NO GROWTH

## 2018-01-10 DIAGNOSIS — H401133 Primary open-angle glaucoma, bilateral, severe stage: Secondary | ICD-10-CM | POA: Diagnosis not present

## 2018-01-22 DIAGNOSIS — H548 Legal blindness, as defined in USA: Secondary | ICD-10-CM | POA: Diagnosis not present

## 2018-01-22 DIAGNOSIS — J449 Chronic obstructive pulmonary disease, unspecified: Secondary | ICD-10-CM | POA: Diagnosis not present

## 2018-01-22 DIAGNOSIS — I10 Essential (primary) hypertension: Secondary | ICD-10-CM | POA: Diagnosis not present

## 2018-01-22 DIAGNOSIS — R5383 Other fatigue: Secondary | ICD-10-CM | POA: Diagnosis not present

## 2018-02-26 DIAGNOSIS — R5383 Other fatigue: Secondary | ICD-10-CM | POA: Diagnosis not present

## 2018-02-26 DIAGNOSIS — J449 Chronic obstructive pulmonary disease, unspecified: Secondary | ICD-10-CM | POA: Diagnosis not present

## 2018-02-26 DIAGNOSIS — H548 Legal blindness, as defined in USA: Secondary | ICD-10-CM | POA: Diagnosis not present

## 2018-02-26 DIAGNOSIS — I10 Essential (primary) hypertension: Secondary | ICD-10-CM | POA: Diagnosis not present

## 2018-03-07 DIAGNOSIS — H548 Legal blindness, as defined in USA: Secondary | ICD-10-CM | POA: Diagnosis not present

## 2018-03-07 DIAGNOSIS — I10 Essential (primary) hypertension: Secondary | ICD-10-CM | POA: Diagnosis not present

## 2018-03-07 DIAGNOSIS — Z23 Encounter for immunization: Secondary | ICD-10-CM | POA: Diagnosis not present

## 2018-03-07 DIAGNOSIS — I6930 Unspecified sequelae of cerebral infarction: Secondary | ICD-10-CM | POA: Diagnosis not present

## 2018-03-07 DIAGNOSIS — J449 Chronic obstructive pulmonary disease, unspecified: Secondary | ICD-10-CM | POA: Diagnosis not present

## 2018-03-14 DIAGNOSIS — H401133 Primary open-angle glaucoma, bilateral, severe stage: Secondary | ICD-10-CM | POA: Diagnosis not present

## 2018-04-24 DIAGNOSIS — J449 Chronic obstructive pulmonary disease, unspecified: Secondary | ICD-10-CM | POA: Diagnosis not present

## 2018-04-24 DIAGNOSIS — H548 Legal blindness, as defined in USA: Secondary | ICD-10-CM | POA: Diagnosis not present

## 2018-04-24 DIAGNOSIS — I6930 Unspecified sequelae of cerebral infarction: Secondary | ICD-10-CM | POA: Diagnosis not present

## 2018-04-24 DIAGNOSIS — I10 Essential (primary) hypertension: Secondary | ICD-10-CM | POA: Diagnosis not present

## 2018-04-30 ENCOUNTER — Other Ambulatory Visit (HOSPITAL_COMMUNITY): Payer: Self-pay | Admitting: Pulmonary Disease

## 2018-04-30 DIAGNOSIS — Z1231 Encounter for screening mammogram for malignant neoplasm of breast: Secondary | ICD-10-CM

## 2018-05-03 ENCOUNTER — Ambulatory Visit (HOSPITAL_COMMUNITY): Payer: Self-pay

## 2018-05-10 ENCOUNTER — Ambulatory Visit (HOSPITAL_COMMUNITY)
Admission: RE | Admit: 2018-05-10 | Discharge: 2018-05-10 | Disposition: A | Discharge status: Home / Self Care | Payer: Medicare Other | Source: Ambulatory Visit | Attending: Pulmonary Disease | Admitting: Pulmonary Disease

## 2018-05-10 DIAGNOSIS — Z1231 Encounter for screening mammogram for malignant neoplasm of breast: Secondary | ICD-10-CM | POA: Diagnosis not present

## 2018-07-23 DIAGNOSIS — H401133 Primary open-angle glaucoma, bilateral, severe stage: Secondary | ICD-10-CM | POA: Diagnosis not present

## 2018-08-27 DIAGNOSIS — M2011 Hallux valgus (acquired), right foot: Secondary | ICD-10-CM | POA: Diagnosis not present

## 2018-08-27 DIAGNOSIS — B351 Tinea unguium: Secondary | ICD-10-CM | POA: Diagnosis not present

## 2018-08-27 DIAGNOSIS — L84 Corns and callosities: Secondary | ICD-10-CM | POA: Diagnosis not present

## 2018-10-04 DIAGNOSIS — L989 Disorder of the skin and subcutaneous tissue, unspecified: Secondary | ICD-10-CM | POA: Diagnosis not present

## 2018-10-04 DIAGNOSIS — H548 Legal blindness, as defined in USA: Secondary | ICD-10-CM | POA: Diagnosis not present

## 2018-10-04 DIAGNOSIS — I10 Essential (primary) hypertension: Secondary | ICD-10-CM | POA: Diagnosis not present

## 2018-10-04 DIAGNOSIS — J449 Chronic obstructive pulmonary disease, unspecified: Secondary | ICD-10-CM | POA: Diagnosis not present

## 2019-01-03 DIAGNOSIS — J449 Chronic obstructive pulmonary disease, unspecified: Secondary | ICD-10-CM | POA: Diagnosis not present

## 2019-01-03 DIAGNOSIS — I6930 Unspecified sequelae of cerebral infarction: Secondary | ICD-10-CM | POA: Diagnosis not present

## 2019-01-03 DIAGNOSIS — J301 Allergic rhinitis due to pollen: Secondary | ICD-10-CM | POA: Diagnosis not present

## 2019-01-03 DIAGNOSIS — I10 Essential (primary) hypertension: Secondary | ICD-10-CM | POA: Diagnosis not present

## 2019-03-08 ENCOUNTER — Other Ambulatory Visit (HOSPITAL_COMMUNITY): Payer: Self-pay | Admitting: Internal Medicine

## 2019-03-08 DIAGNOSIS — Z1231 Encounter for screening mammogram for malignant neoplasm of breast: Secondary | ICD-10-CM

## 2019-05-13 ENCOUNTER — Ambulatory Visit (HOSPITAL_COMMUNITY): Payer: Medicare Other

## 2019-07-09 IMAGING — DX DG CHEST 2V
2 series · 2 of 2 positions shown · non-contrast
Comparison: 05/09/2017

CLINICAL DATA: Chest pain.

EXAM:
CHEST  2 VIEW

[chest pa]
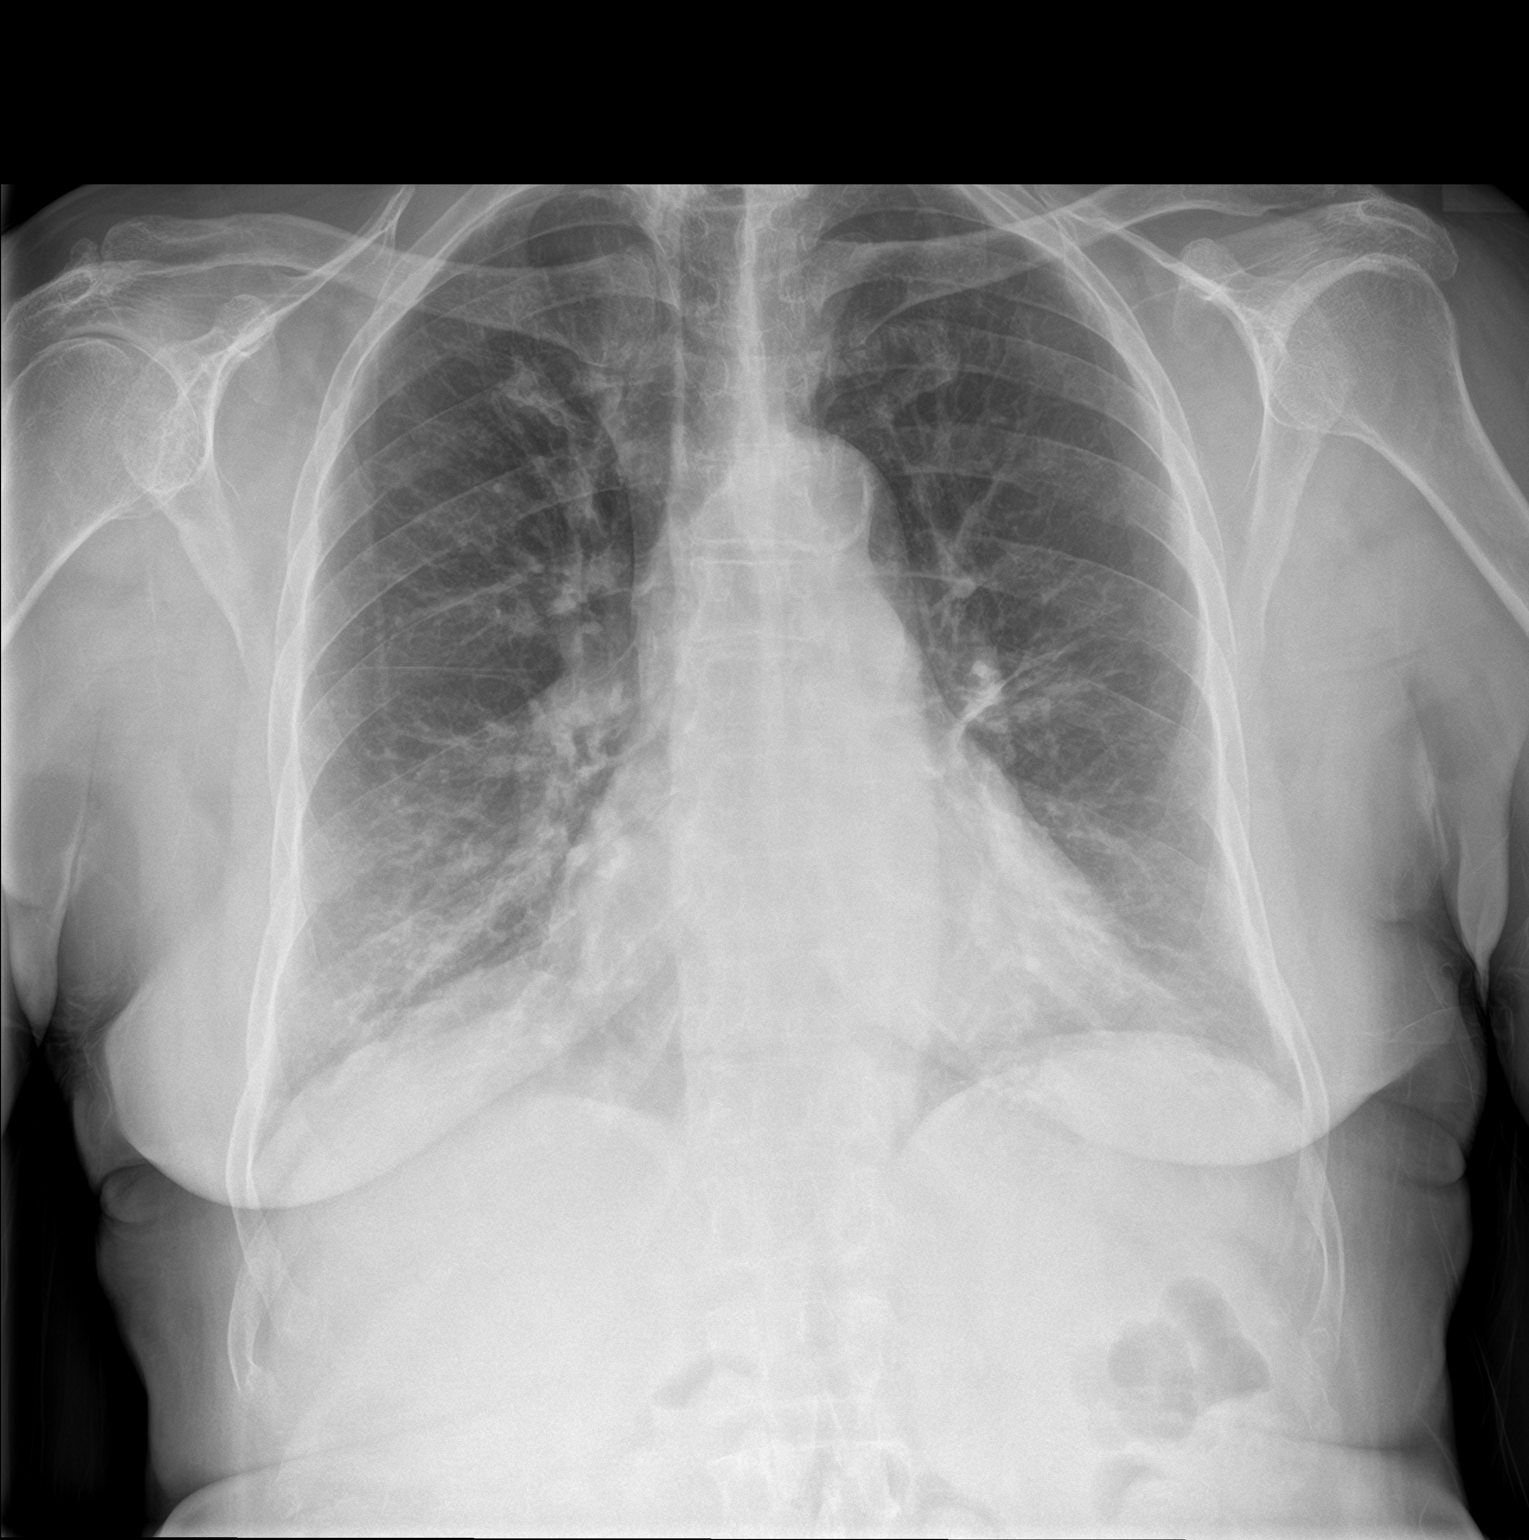

[chest lat]
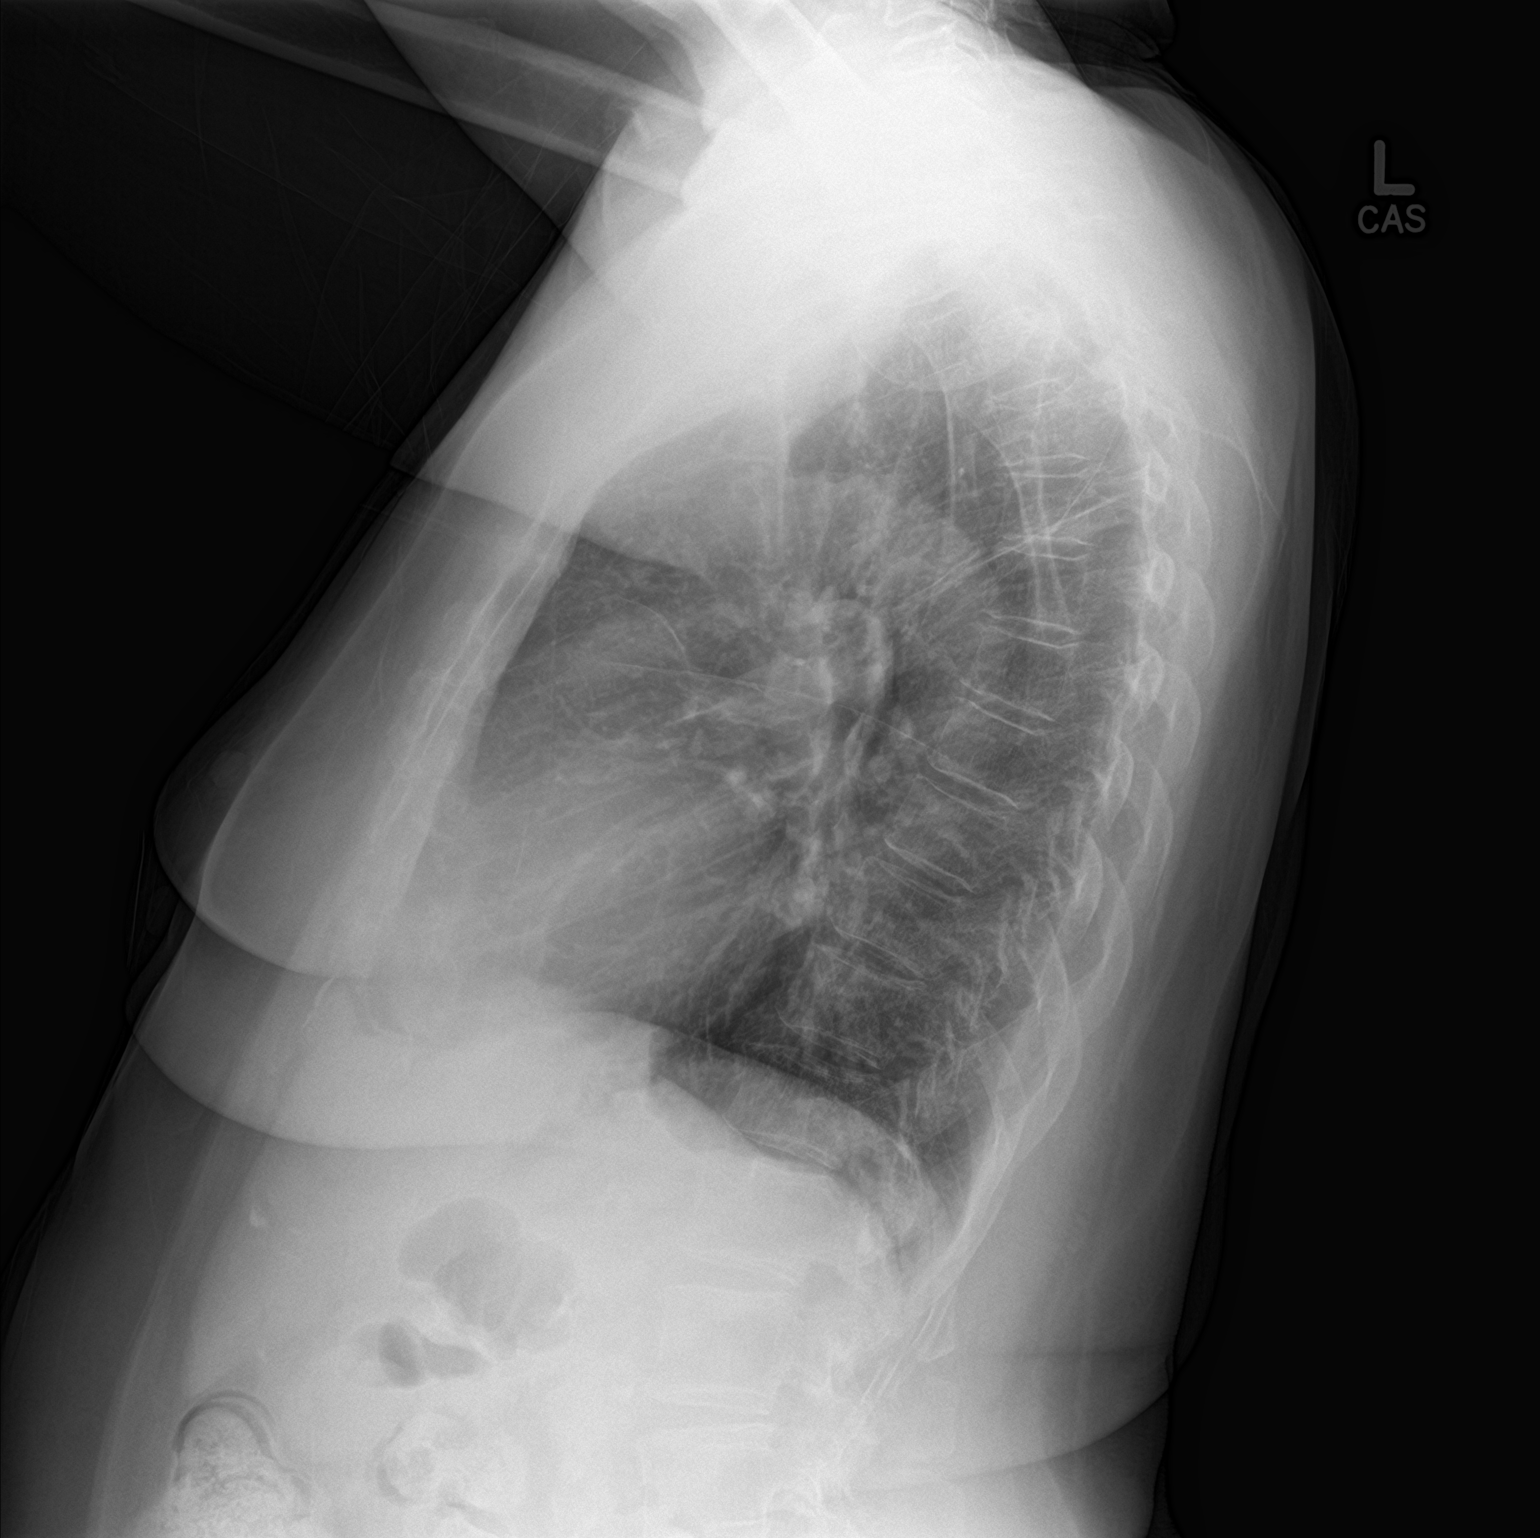

[2 of 2 positions shown; findings below may reference images not displayed]

FINDINGS: Normal heart size and mediastinal contours. Interstitial coarsening
with cuffing seen about the hila. No Kerley lines, air bronchogram,
effusion, or pneumothorax.
IMPRESSION: Generalized bronchitic markings.

## 2019-08-26 ENCOUNTER — Ambulatory Visit: Payer: Medicare Other | Attending: Internal Medicine

## 2019-08-26 ENCOUNTER — Other Ambulatory Visit: Payer: Self-pay

## 2019-08-26 DIAGNOSIS — Z20822 Contact with and (suspected) exposure to covid-19: Secondary | ICD-10-CM

## 2019-08-27 ENCOUNTER — Telehealth: Payer: Self-pay

## 2019-08-27 LAB — NOVEL CORONAVIRUS, NAA: SARS-CoV-2, NAA: NOT DETECTED

## 2019-08-27 NOTE — Telephone Encounter (Signed)
Phone call to [place] @ "[facility]" and advised of pt's. COVID result showing negative; the virus was not detected.  Verb. Understanding.

## 2019-08-28 ENCOUNTER — Other Ambulatory Visit: Payer: Self-pay

## 2019-09-18 ENCOUNTER — Other Ambulatory Visit: Payer: Self-pay

## 2019-09-18 ENCOUNTER — Ambulatory Visit (HOSPITAL_COMMUNITY)
Admission: RE | Admit: 2019-09-18 | Discharge: 2019-09-18 | Disposition: A | Discharge status: Home / Self Care | Payer: Medicare Other | Source: Ambulatory Visit | Attending: Internal Medicine | Admitting: Internal Medicine

## 2019-09-18 DIAGNOSIS — Z1231 Encounter for screening mammogram for malignant neoplasm of breast: Secondary | ICD-10-CM | POA: Insufficient documentation

## 2019-09-21 DIAGNOSIS — R079 Chest pain, unspecified: Secondary | ICD-10-CM | POA: Diagnosis not present

## 2019-09-21 DIAGNOSIS — N179 Acute kidney failure, unspecified: Secondary | ICD-10-CM | POA: Diagnosis not present

## 2019-09-22 DIAGNOSIS — N179 Acute kidney failure, unspecified: Secondary | ICD-10-CM | POA: Diagnosis not present

## 2019-09-22 DIAGNOSIS — I272 Pulmonary hypertension, unspecified: Secondary | ICD-10-CM | POA: Diagnosis not present

## 2019-09-22 DIAGNOSIS — R7989 Other specified abnormal findings of blood chemistry: Secondary | ICD-10-CM | POA: Diagnosis not present

## 2019-09-22 DIAGNOSIS — R079 Chest pain, unspecified: Secondary | ICD-10-CM | POA: Diagnosis not present

## 2019-09-23 DIAGNOSIS — N179 Acute kidney failure, unspecified: Secondary | ICD-10-CM | POA: Diagnosis not present

## 2019-09-23 DIAGNOSIS — R079 Chest pain, unspecified: Secondary | ICD-10-CM | POA: Diagnosis not present

## 2019-09-24 DIAGNOSIS — R079 Chest pain, unspecified: Secondary | ICD-10-CM | POA: Diagnosis not present

## 2019-09-24 DIAGNOSIS — N179 Acute kidney failure, unspecified: Secondary | ICD-10-CM | POA: Diagnosis not present

## 2019-09-25 DIAGNOSIS — R079 Chest pain, unspecified: Secondary | ICD-10-CM | POA: Diagnosis not present

## 2019-09-25 DIAGNOSIS — I214 Non-ST elevation (NSTEMI) myocardial infarction: Secondary | ICD-10-CM | POA: Diagnosis not present

## 2019-09-25 DIAGNOSIS — R001 Bradycardia, unspecified: Secondary | ICD-10-CM | POA: Diagnosis not present

## 2019-09-25 DIAGNOSIS — I959 Hypotension, unspecified: Secondary | ICD-10-CM | POA: Diagnosis not present

## 2019-09-25 DIAGNOSIS — J9601 Acute respiratory failure with hypoxia: Secondary | ICD-10-CM | POA: Diagnosis not present

## 2019-09-25 DIAGNOSIS — N179 Acute kidney failure, unspecified: Secondary | ICD-10-CM | POA: Diagnosis not present

## 2019-10-19 DEATH — deceased
